# Patient Record
Sex: Female | Born: 1960 | Race: Black or African American | Hispanic: No | Marital: Married | Smoking: Former smoker
Health system: Southern US, Community
[De-identification: ages and names within clinical notes are randomized; demographics above are authoritative.]

## PROBLEM LIST (undated history)

## (undated) DIAGNOSIS — IMO0001 Reserved for inherently not codable concepts without codable children: Secondary | ICD-10-CM

## (undated) DIAGNOSIS — R252 Cramp and spasm: Secondary | ICD-10-CM

## (undated) DIAGNOSIS — Z8709 Personal history of other diseases of the respiratory system: Secondary | ICD-10-CM

## (undated) DIAGNOSIS — I639 Cerebral infarction, unspecified: Secondary | ICD-10-CM

## (undated) DIAGNOSIS — G4730 Sleep apnea, unspecified: Secondary | ICD-10-CM

## (undated) DIAGNOSIS — R350 Frequency of micturition: Secondary | ICD-10-CM

## (undated) DIAGNOSIS — E781 Pure hyperglyceridemia: Secondary | ICD-10-CM

## (undated) DIAGNOSIS — L309 Dermatitis, unspecified: Secondary | ICD-10-CM

## (undated) DIAGNOSIS — D573 Sickle-cell trait: Secondary | ICD-10-CM

## (undated) DIAGNOSIS — Z8701 Personal history of pneumonia (recurrent): Secondary | ICD-10-CM

## (undated) DIAGNOSIS — I10 Essential (primary) hypertension: Secondary | ICD-10-CM

## (undated) DIAGNOSIS — E119 Type 2 diabetes mellitus without complications: Secondary | ICD-10-CM

## (undated) DIAGNOSIS — K219 Gastro-esophageal reflux disease without esophagitis: Secondary | ICD-10-CM

## (undated) DIAGNOSIS — E785 Hyperlipidemia, unspecified: Secondary | ICD-10-CM

## (undated) DIAGNOSIS — M797 Fibromyalgia: Secondary | ICD-10-CM

## (undated) DIAGNOSIS — J45909 Unspecified asthma, uncomplicated: Secondary | ICD-10-CM

## (undated) DIAGNOSIS — D649 Anemia, unspecified: Secondary | ICD-10-CM

## (undated) HISTORY — PX: ABDOMINAL HYSTERECTOMY: SHX81

## (undated) HISTORY — PX: DIAGNOSTIC LAPAROSCOPY: SUR761

---

## 2014-08-11 ENCOUNTER — Emergency Department: Payer: Self-pay | Admitting: Emergency Medicine

## 2014-08-13 ENCOUNTER — Emergency Department: Payer: Self-pay | Admitting: Emergency Medicine

## 2014-10-24 ENCOUNTER — Emergency Department: Payer: Self-pay | Admitting: Emergency Medicine

## 2014-10-24 LAB — CBC
HCT: 41.1 % (ref 35.0–47.0)
HGB: 13.6 g/dL (ref 12.0–16.0)
MCH: 28.6 pg (ref 26.0–34.0)
MCHC: 33.1 g/dL (ref 32.0–36.0)
MCV: 87 fL (ref 80–100)
Platelet: 188 10*3/uL (ref 150–440)
RBC: 4.75 10*6/uL (ref 3.80–5.20)
RDW: 13.9 % (ref 11.5–14.5)
WBC: 9.5 10*3/uL (ref 3.6–11.0)

## 2014-10-24 LAB — URINALYSIS, COMPLETE
BACTERIA: NONE SEEN
Bilirubin,UR: NEGATIVE
Blood: NEGATIVE
KETONE: NEGATIVE
Leukocyte Esterase: NEGATIVE
NITRITE: NEGATIVE
Ph: 6 (ref 4.5–8.0)
Protein: NEGATIVE
RBC,UR: 1 /HPF (ref 0–5)
Specific Gravity: 1.017 (ref 1.003–1.030)
Squamous Epithelial: 1
WBC UR: 2 /HPF (ref 0–5)

## 2014-10-24 LAB — COMPREHENSIVE METABOLIC PANEL
ALK PHOS: 136 U/L — AB
ALT: 26 U/L
Albumin: 3.5 g/dL (ref 3.4–5.0)
Anion Gap: 11 (ref 7–16)
BUN: 12 mg/dL (ref 7–18)
Bilirubin,Total: 0.5 mg/dL (ref 0.2–1.0)
CO2: 28 mmol/L (ref 21–32)
CREATININE: 0.91 mg/dL (ref 0.60–1.30)
Calcium, Total: 9 mg/dL (ref 8.5–10.1)
Chloride: 94 mmol/L — ABNORMAL LOW (ref 98–107)
Glucose: 504 mg/dL (ref 65–99)
OSMOLALITY: 289 (ref 275–301)
Potassium: 4.5 mmol/L (ref 3.5–5.1)
SGOT(AST): 17 U/L (ref 15–37)
Sodium: 133 mmol/L — ABNORMAL LOW (ref 136–145)
Total Protein: 7.7 g/dL (ref 6.4–8.2)

## 2014-11-14 ENCOUNTER — Emergency Department: Payer: Self-pay | Admitting: Emergency Medicine

## 2014-11-14 LAB — URINALYSIS, COMPLETE
BILIRUBIN, UR: NEGATIVE
Blood: NEGATIVE
Glucose,UR: >=500 mg/dL (ref 0–75)
Nitrite: NEGATIVE
Ph: 5 (ref 4.5–8.0)
Protein: NEGATIVE
RBC,UR: 15 /HPF (ref 0–5)
SPECIFIC GRAVITY: 1.023 (ref 1.003–1.030)
Squamous Epithelial: 2
WBC UR: 3 /HPF (ref 0–5)

## 2014-11-14 LAB — COMPREHENSIVE METABOLIC PANEL
ALT: 38 U/L
Albumin: 3.9 g/dL (ref 3.4–5.0)
Alkaline Phosphatase: 208 U/L — ABNORMAL HIGH
Anion Gap: 14 (ref 7–16)
BILIRUBIN TOTAL: 0.7 mg/dL (ref 0.2–1.0)
BUN: 15 mg/dL (ref 7–18)
CREATININE: 0.88 mg/dL (ref 0.60–1.30)
Calcium, Total: 9.8 mg/dL (ref 8.5–10.1)
Chloride: 88 mmol/L — ABNORMAL LOW (ref 98–107)
Co2: 24 mmol/L (ref 21–32)
EGFR (African American): >60
EGFR (Non-African Amer.): >60
Glucose: 736 mg/dL (ref 65–99)
OSMOLALITY: 290 (ref 275–301)
Potassium: 4.4 mmol/L (ref 3.5–5.1)
SGOT(AST): 27 U/L (ref 15–37)
Sodium: 126 mmol/L — ABNORMAL LOW (ref 136–145)
Total Protein: 8.5 g/dL — ABNORMAL HIGH (ref 6.4–8.2)

## 2014-11-14 LAB — CBC WITH DIFFERENTIAL/PLATELET
BASOS PCT: 0.5 %
Basophil #: 0.1 10*3/uL (ref 0.0–0.1)
EOS ABS: 0.1 10*3/uL (ref 0.0–0.7)
Eosinophil %: 1.4 %
HCT: 44.8 % (ref 35.0–47.0)
HGB: 14.7 g/dL (ref 12.0–16.0)
Lymphocyte #: 2.5 10*3/uL (ref 1.0–3.6)
Lymphocyte %: 24.7 %
MCH: 28.8 pg (ref 26.0–34.0)
MCHC: 32.9 g/dL (ref 32.0–36.0)
MCV: 88 fL (ref 80–100)
MONO ABS: 0.7 x10 3/mm (ref 0.2–0.9)
Monocyte %: 6.8 %
Neutrophil #: 6.7 10*3/uL — ABNORMAL HIGH (ref 1.4–6.5)
Neutrophil %: 66.6 %
Platelet: 247 10*3/uL (ref 150–440)
RBC: 5.12 10*6/uL (ref 3.80–5.20)
RDW: 13.7 % (ref 11.5–14.5)
WBC: 10 10*3/uL (ref 3.6–11.0)

## 2014-11-18 HISTORY — PX: REDUCTION MAMMAPLASTY: SUR839

## 2014-12-12 ENCOUNTER — Observation Stay: Payer: Self-pay | Admitting: Internal Medicine

## 2014-12-12 LAB — BASIC METABOLIC PANEL
ANION GAP: 10 (ref 7–16)
BUN: 14 mg/dL (ref 7–18)
CALCIUM: 8.1 mg/dL — AB (ref 8.5–10.1)
CO2: 24 mmol/L (ref 21–32)
Chloride: 103 mmol/L (ref 98–107)
Creatinine: 0.74 mg/dL (ref 0.60–1.30)
EGFR (African American): >60
EGFR (Non-African Amer.): >60
Glucose: 373 mg/dL — ABNORMAL HIGH (ref 65–99)
Osmolality: 290 (ref 275–301)
Potassium: 4 mmol/L (ref 3.5–5.1)
Sodium: 137 mmol/L (ref 136–145)

## 2014-12-12 LAB — URINALYSIS, COMPLETE
Bilirubin,UR: NEGATIVE
Glucose,UR: >=500 mg/dL (ref 0–75)
NITRITE: NEGATIVE
PROTEIN: NEGATIVE
Ph: 5 (ref 4.5–8.0)
RBC,UR: 7 /HPF (ref 0–5)
Specific Gravity: 1.02 (ref 1.003–1.030)
WBC UR: 15 /HPF (ref 0–5)

## 2014-12-12 LAB — COMPREHENSIVE METABOLIC PANEL
ALBUMIN: 3.1 g/dL — AB (ref 3.4–5.0)
ALT: 30 U/L
ANION GAP: 16 (ref 7–16)
AST: 24 U/L (ref 15–37)
Alkaline Phosphatase: 136 U/L — ABNORMAL HIGH
BUN: 16 mg/dL (ref 7–18)
Bilirubin,Total: 0.5 mg/dL (ref 0.2–1.0)
Calcium, Total: 9.2 mg/dL (ref 8.5–10.1)
Chloride: 90 mmol/L — ABNORMAL LOW (ref 98–107)
Co2: 23 mmol/L (ref 21–32)
Creatinine: 0.87 mg/dL (ref 0.60–1.30)
EGFR (African American): >60
EGFR (Non-African Amer.): >60
GLUCOSE: 624 mg/dL — AB (ref 65–99)
Osmolality: 289 (ref 275–301)
Potassium: 4 mmol/L (ref 3.5–5.1)
Sodium: 129 mmol/L — ABNORMAL LOW (ref 136–145)
Total Protein: 7.6 g/dL (ref 6.4–8.2)

## 2014-12-12 LAB — BETA-HYDROXYBUTYRIC ACID: Beta-Hydroxybutyrate: 34.5 mg/dL — ABNORMAL HIGH (ref 0.2–2.8)

## 2014-12-12 LAB — HEMOGLOBIN A1C

## 2014-12-12 LAB — CBC
HCT: 41.3 % (ref 35.0–47.0)
HGB: 13.5 g/dL (ref 12.0–16.0)
MCH: 28.2 pg (ref 26.0–34.0)
MCHC: 32.5 g/dL (ref 32.0–36.0)
MCV: 87 fL (ref 80–100)
Platelet: 264 10*3/uL (ref 150–440)
RBC: 4.78 10*6/uL (ref 3.80–5.20)
RDW: 13.7 % (ref 11.5–14.5)
WBC: 16.6 10*3/uL — AB (ref 3.6–11.0)

## 2014-12-13 LAB — LIPID PANEL
Cholesterol: 143 mg/dL (ref 0–200)
HDL Cholesterol: 25 mg/dL — ABNORMAL LOW (ref 40–60)
Triglycerides: 635 mg/dL — ABNORMAL HIGH (ref 0–200)

## 2014-12-13 LAB — VANCOMYCIN, TROUGH: Vancomycin, Trough: 9 ug/mL — ABNORMAL LOW (ref 10–20)

## 2014-12-13 LAB — TSH: Thyroid Stimulating Horm: 1.37 u[IU]/mL

## 2014-12-14 LAB — VANCOMYCIN, TROUGH: Vancomycin, Trough: 11 ug/mL (ref 10–20)

## 2014-12-21 ENCOUNTER — Encounter: Payer: Self-pay | Admitting: Surgery

## 2014-12-29 ENCOUNTER — Encounter: Payer: Self-pay | Admitting: Surgery

## 2015-01-05 ENCOUNTER — Encounter: Payer: Self-pay | Admitting: Surgery

## 2015-01-12 DIAGNOSIS — I10 Essential (primary) hypertension: Secondary | ICD-10-CM | POA: Insufficient documentation

## 2015-01-12 DIAGNOSIS — E782 Mixed hyperlipidemia: Secondary | ICD-10-CM | POA: Insufficient documentation

## 2015-01-17 ENCOUNTER — Encounter: Payer: Self-pay | Admitting: Surgery

## 2015-03-19 NOTE — H&P (Signed)
PATIENT NAME:  Kristen Harris, MONGEAU MR#:  [id number] DATE OF BIRTH:  [birth year]  DATE OF ADMISSION:  12/12/2014  REFERRING PHYSICIAN:  Larae Grooms, MD  PRIMARY CARE PHYSICIAN:  Nonlocal.   ADMISSION DIAGNOSIS:  Hyperglycemia.   HISTORY OF PRESENT ILLNESS:  This is a 54 year old African-American female who presents to the Emergency Department complaining of one day of nausea, vomiting, and headache. The patient has had multiple episodes of emesis, none of which were bloody or bilious. Her headache is mostly global and comes and goes. She has not had any visual changes or problems with balance. Upon arrival to the Emergency Department, she was found to have a blood sugar greater than 600 as well as 1+ ketones in her urine. The rest of the patient's laboratory values were essentially normal, albeit a low sodium secondary to her elevated blood sugar. She also was found to have an abscess on her abdomen, which was drained and packed in the Emergency Department. After 2 liters of fluid resuscitation and 20 units of IV insulin, the patient's blood sugar came down to 442. Due to her ongoing need for glucose management as well as wound care, the Emergency Department called for admission.   REVIEW OF SYSTEMS: CONSTITUTIONAL:  The patient denies fever or weakness.  EYES:  Denies inflammation or blurred vision.  EARS, NOSE, AND THROAT:  Denies tinnitus or sore throat.  RESPIRATORY:  Denies cough or shortness of breath.  CARDIOVASCULAR:  Denies chest pain, palpitations, orthopnea, or paroxysmal nocturnal dyspnea.  GASTROINTESTINAL:  Admits to nausea and vomiting but denies diarrhea or abdominal pain at this time (the patient admits to diarrhea within the last 2 weeks due to metformin, but she has discontinued this medication).  GENITOURINARY:  Denies dysuria or increased frequency or hesitancy of urination, although today, she does admit that she feels like she has had to urinate more frequently.  ENDOCRINE:   Admits to polydipsia but denies polyuria.  HEMATOLOGIC AND LYMPHATIC:  Denies easy bruising or bleeding.  INTEGUMENTARY:  Admits to a rash under her breasts as well as a tender area on her anterior abdomen.  MUSCULOSKELETAL:  Denies myalgias or arthralgias.  NEUROLOGIC:  Admits to headache but denies numbness in her extremities or dysarthria.  PSYCHIATRIC:  Denies depression or suicidal ideation.   PAST MEDICAL HISTORY:  Diabetes type 2, fibromyalgia, gastroesophageal reflux disease, and hyperlipidemia.   PAST SURGICAL HISTORY:  Two C-sections and a hysterectomy.   SOCIAL HISTORY:  The patient does not smoke tobacco or do any illicit drugs. She is an occasional drinker.   FAMILY HISTORY:  Significant for diabetes in her father and various forms of cancer in her mother's relatives.   MEDICATIONS: 1.  Albuterol 90 mcg per inhalation 2 puffs inhaled 4 times a day as needed for coughing, wheezing, or shortness of breath.  2.  Cymbalta 30 mg delayed-release capsule 1 capsule p.o. daily.  3.  Januvia 100 mg 1 tablet p.o. daily.  4.  Lisinopril 10 mg 1 tab p.o. daily.  5.  Victoza 18 mg per 3 mL subcutaneous solution use as directed.   ALLERGIES:  ASPIRIN, ERYTHROMYCIN, PENICILLINS, AND TETRACYCLINE.   PERTINENT LABORATORY RESULTS AND RADIOGRAPHIC FINDINGS:  Serum glucose was originally 624, BUN 16, creatinine 0.87, serum sodium 129, potassium 4, chloride 90, bicarbonate 23, anion gap 16, calcium 9.2, beta hydroxybutyrate 34.5, serum albumin 3.1, alkaline phosphatase 136, AST 24, ALT 30. White blood cell count is 16.6, hemoglobin 13.5, hematocrit 41.3, platelet count 264,000. MCV is 87.  Urinalysis shows 1+ ketones and 2+ blood. It is nitrite negative. Leukocyte esterase is 1+. There is trace bacteria present.   CT of the head without contrast shows no acute intracranial abnormalities.   PHYSICAL EXAMINATION: VITAL SIGNS:  Temperature is 98.5, pulse 100, respirations 20, blood pressure  122/70, pulse oximetry 96% on room air.  GENERAL:  The patient is alert and oriented and in no apparent distress.  HEENT:  Normocephalic, atraumatic. Pupils are equal, round, and reactive to light and accommodation. Extraocular movements are intact. Mucous membranes are dry. The patient's hairline is receding from her forehead down her temples, and she has very thin hair in this area.  NECK:  Trachea is midline. No adenopathy. Thyroid is nonpalpable and nontender.  CHEST:  Symmetric and atraumatic.  CARDIOVASCULAR:  Regular rate and rhythm. Normal S1 and S2. No rubs, clicks, or murmurs.  LUNGS:  Clear to auscultation bilaterally. Normal effort and excursion.  ABDOMEN:  Positive bowel sounds. Soft, nontender, nondistended. No hepatosplenomegaly.  GENITOURINARY:  Deferred.  MUSCULOSKELETAL:  The patient moves all 4 extremities equally. I have not walked her. She has 5/5 strength in upper and lower extremities bilaterally.  SKIN:  It is warm and dry. The patient has intertriginous rashes that appear somewhat scaly but have other lesions which have somewhat raised borders with areas of central clearing and are on an erythematous base. On the patient's anterior abdomen there is an approximately 1 cm in diameter area of fluctuance that is tender and warm. She has another similar area that is actually nonfluctuant on her right flank that is somewhat smaller in size.  EXTREMITIES:  No clubbing, cyanosis, or edema.  NEUROLOGIC:  Cranial nerves II through XII are grossly intact.  PSYCHIATRIC:  Mood is normal. Affect is congruent. The patient has excellent insight and judgment into her medical condition.   ASSESSMENT AND PLAN:  This is a 54 year old female admitted for hyperglycemia.   1.  Hyperglycemia. The patient may be in the early stages of diabetic ketoacidosis, but she has no anion gap yet and her bicarbonate is normal at this time. Her blood sugar has greatly improved with intravenous fluid and  intravenous insulin. Once her blood sugar normalizes, we can start some basal insulin in addition to prandial dosing. I have ordered a diabetic nurse educator consult as well as a dietitian consult, as the patient states she is very confused as to why she keeps having these episodes of hyperglycemia and with hospitalization. It is clear at this time, however, that the patient is somehow on Januvia and Victoza simultaneously. This is not doing much to control the patient's blood sugar, and I have explained this to her. This is likely some miscommunication between her and the pharmacy or the doctor's office and the pharmacy. She will likely be a much better candidate for injectable insulin, as she does not tolerate metformin due to gastrointestinal side effects.  2.  Hyperlipidemia. Continue statin therapy.  3.  Gastroesophageal reflux disease. The patient may have an H2-blocker p.r.n.  4.  Fibromyalgia. Continue Cymbalta.  5.  Obesity. The patient's body mass index is 33.6. I have encouraged healthy diet and exercise.  6.  Deep vein thrombosis prophylaxis. Heparin.  7.  Gastrointestinal prophylaxis. None.   CODE STATUS:  The patient is a full code.   TIME SPENT ON ADMISSION ORDERS AND PATIENT CARE:  Approximately 35 minutes.   ____________________________ Norva Riffle. Marcille Blanco, MD msd:nb D: 12/12/2014 04:08:46 ET T: 12/12/2014 06:05:49 ET JOB#: [id number]  cc: Norva Riffle. Marcille Blanco, MD, <Dictator> Norva Riffle Eevie Lapp MD ELECTRONICALLY SIGNED 12/14/2014 1:52

## 2015-03-19 NOTE — Consult Note (Signed)
PATIENT NAME:  Kristen Harris, Kristen Harris MR#:  [id number] DATE OF BIRTH:  December 12, 1960  DATE OF CONSULTATION:  12/13/2014  REFERRING PHYSICIAN:  Epifanio Lesches, MD.   CONSULTING PHYSICIAN:  A. Lavone Orn, MD  ENDOCRINOLOGY CONSULTATON   CHIEF COMPLAINT: Hyperglycemia.   HISTORY OF PRESENT ILLNESS: This is a 54 year old female admitted yesterday with severe hyperglycemia as well as 1 day nausea, vomiting, and headache. Initial blood sugar was found to be greater than 600. She did not have acidosis. She did have 1+ urinary ketones and an elevated beta hydroxybutyrate level. She was found to have small ulceration beneath the bilateral breasts. She was admitted for management of diabetes and the infection. She has been started on antibiotics. She has been started on subcutaneous insulin. Over the last 24 hours, she has received Lantus 12 units as well as NovoLog per sliding scale, total of 22 units and Januvia 100 mg daily. Blood sugars have ranged 245-419. The patient feels poorly. Nausea has resolved. She is eating fairly well.   The patient reports she was diagnosed with diabetes in 2012. She recently relocated to [place] to take care of her ill father. She moved from the [place], [place] area. After moving here, she did not have regular medical care. She states she was off her diabetes medications for a while and then had trouble getting them consistently. States she has had several visits to the Emergency Room due to hyperglycemia. Outpatient medications had been Victoza 1.8 mg daily and Januvia 100 mg daily. The patient reports no known complications from diabetes. She denies hospitalizations for diabetes.   PAST MEDICAL HISTORY:  1. Type 2 diabetes.  2. GERD.  3. Hyperlipidemia.   PAST SURGICAL HISTORY:  1. C-section x2.  2. Hysterectomy.   SOCIAL HISTORY: The patient moved from [place] to [place] within last year. She lives with her husband and her 2 parents.  She has several children who are adult now. She does not smoke. Quit tobacco in 2010.  Social alcohol only.   FAMILY HISTORY: Father has diabetes and has had amputation of a lower extremity.   ALLERGIES:  ASPIRIN, ERYTHROMYCIN, PENICILLIN, TETRACYCLINE.   CURRENT MEDICATIONS:  1. Levemir 12 units at bedtime.  2. NovoLog insulin sliding scale.  3. Duloxetine 30 mg daily.  4. Januvia 100 mg daily.  5. Lopid 600 mg b.i.d.  6. Zestril 10 mg daily.  7. Omeprazole 20 mg daily.  8. Omega-3 fatty acid 2 grams daily.  9. Vancomycin 1 gram q. 8 hours IV.  10. Heparin 5000 units subcutaneous q. 8 hours.  REVIEW OF SYSTEMS:  GENERAL:  Denies headache.  Denies fever.  HEENT: Denies blurred vision. Denies sore throat.  NECK: Denies neck pain.  Denies dysphasia.  CARDIAC: Denies chest pain. Denies palpitation.  PULMONARY: Denies cough. Denies shortness breath.  ABDOMEN: Nausea and vomiting have resolved. Appetite is fair.  GENITOURINARY: Denies dysuria. Denies hematuria.  ENDOCRINE: Denies heat or cold intolerance.  HEMATOLOGIC: Denies easy bruising or recent bleeding.  SKIN:  She has a rash beneath both breasts and in the groin region. She reports small ulcerations under the breasts bilaterally.  NEUROLOGIC: No recent falls.  MUSCULOSKELETAL: Denies swelling or weakness.   PHYSICAL EXAMINATION:  VITAL SIGNS: Height 65.9 inches, weight 197 pounds, BMI 32, temperature 98.2, pulse 90, respirations 17, blood pressure 120/78.  GENERAL: Obese, African American female in no acute distress.  HEENT: EOMI.  Oropharynx is clear. Mucous membranes moist.  NECK: Supple. No appreciable thyromegaly.  CARDIAC: Regular rate and rhythm without murmur.  PULMONARY: Clear to auscultation bilaterally. No wheeze.  ABDOMEN: Diffusely soft, nontender, nondistended.  EXTREMITIES: No peripheral edema is present.  PSYCHIATRIC: Calm, cooperative, alert and oriented.  SKIN: There is an erythemic rash between the  breasts bilaterally. There is a less than 1 cm a healing ulceration beneath the right breast and approximately 2 cm ulceration beneath the left breast. Both are clean, dry and packed and dressed. No discharge.   LABORATORY DATA: Hemoglobin A1c pending.  Additional labs yesterday include glucose 373, BUN 14, creatinine 0.74, sodium 137, potassium 4.0, calcium 8.1, WBC 16.6, hematocrit 41.3, fructosamine 613.   ASSESSMENT:  1. Uncontrolled type 2 diabetes, uncomplicated.  2. Abscesses on chest wall bilaterally.  3. Obesity.   RECOMMENDATIONS:  1. Agree with basal-bolus insulin. She would benefit from scheduled prandial insulin in addition to the sliding scale.  2. Increase Levemir to 30 units at bedtime.  3. Add NovoLog 10 units t.i.d. a.c.  4. Continue current NovoLog insulin sliding scale.  5. Appreciate assistance of clinical diabetes educators.  6. Encourage the patient to monitor her blood sugars 3 times daily. Bring glucometer to each clinic visit.  7. Plan to follow up with her in 1-2 weeks after discharge.   I will follow along with you during hospitalization, as needed.    ____________________________ A. Lavone Orn, MD ams:by D: 12/13/2014 17:21:34 ET T: 12/13/2014 17:43:54 ET JOB#: [id number]  cc: A. Lavone Orn, MD, <Dictator> Sherlon Handing MD ELECTRONICALLY SIGNED 12/21/2014 13:03

## 2015-03-19 NOTE — Discharge Summary (Signed)
PATIENT NAME:  Kristen Harris, Kristen Harris MR#:  [id number] DATE OF BIRTH:  July 28, 1961  DATE OF ADMISSION:  12/12/2014 DATE OF DISCHARGE:  12/14/2014  DISCHARGE DIAGNOSES:  1.  Nausea and vomiting secondary to poorly controlled type 2 diabetes mellitus.  2.  Chest wall abscess, status post drainage.  3.  Hypertriglyceridemia.   DISCHARGE MEDICATIONS: Cymbalta 30 mg p.o. daily, albuterol 90 mcg inhalation 2 puffs 4 times daily, lisinopril 10 mg daily, Januvia 100 mg daily, omeprazole 20 mg p.o. daily,10 mg p.o. daily, Levemir 30 units at bedtime, NovoLog 10 units t.i.d., gemfibrozil 600 mg p.o. b.i.d., Bactrim 1 tablet p.o. b.i.d. for 10 days. Victoza is stopped. The patient is also on sliding scale with coverage for a blood sugar of 151-200, she is advised to take 2 units; sugar of 201-250, four units; 251-300, six units; and 301-350, eight units. The patient should a.c. and bedtime blood sugar checks.   DIET: Low-sodium, low-fat, ADA diet.   CONSULTATION: With endocrinology from Dr. Gabriel Carina.   HOSPITAL COURSE: This is a 54 year old female patient who came in because of nausea, vomiting, and headache. Blood sugar was found to be 600. The patient had chest wall abscess, which was drained in the ER, and she received vancomycin while in the hospital. The patient admitted to hospitalist service for nausea, vomiting and impending DKA. The patient's BUN 16, creatinine 0.87, sodium 129, potassium 4,. The patient received aggressive IV hydration and insulin with sliding scale coverage. The patient's symptoms improved nicely with aggressive hydration, and also patient started on Levemir along with sliding scale coverage. Hemoglobin A1c obtained and it was 15. The patient's creatinine was normal on admission. The patient's sodium improved to 137 with hydration. She had dizziness on admission. CT head was normal. As her blood sugar improved, the patient's TSH was normal. The patient was seen by Dr. Gabriel Carina. She suggested that the  patient can take NovoLog 10 units t.i.d. and Levemir at 30 units at bedtime. We also had our inpatient diabetic team consult and follow up with them. The patient is encouraged to have a blood sugar monitor and check 3 times daily. The patient will follow up with Dr. Gabriel Carina as an outpatient in about 1-2 weeks and also follow up with [facility] as an outpatient for diabetes management. The patient's symptoms nicely improved, and she said she feels much better than when she came. Advised her to be compliant with her medication for diabetes and also follow up with annual eye checkup. The patient also had hypertriglyceridemia. The patient's triglycerides were elevated; triglycerides in the blood were 635 and LDL could not be calculated because of severe elevation of triglycerides. We started her on gemfibrozil 600 mg p.o. b.i.d. Advised her to follow up with Dr. Gabriel Carina in about 1-2 weeks and then also have to have a 9-month checkup to follow lipids checkup and followup. The patient does have a chest wall abscess, which was drained in the Emergency Room. She received vancomycin in the hospital and discharged home with Bactrim DS 1 tablet b.i.d. for 10 days. The patient did not have any fever in the hospital.   DISCHARGE PHYSICAL EXAMINATION:  VITAL SIGNS: Temperature 98.4, heart rate 83, blood pressure 114/78, saturating 97% on room air.  CARDIOVASCULAR: S1, S2 regular. LUNGS: Clear to auscultation. No wheeze, no rales.  ABDOMEN: Soft, nontender, nondistended. Bowel sounds present. CHEST: Wall abscess drained and no evidence of collection, no evidence of abscess. Advised her to change dressings as needed.   TIME SPENT:  More than 30 minutes.    ____________________________ Epifanio Lesches, MD sk:bm D: 12/17/2014 16:29:00 ET T: 12/18/2014 03:10:21 ET JOB#: [id number]  cc: Epifanio Lesches, MD, <Dictator> A. Lavone Orn, MD Epifanio Lesches MD ELECTRONICALLY SIGNED 12/20/2014 13:34

## 2015-03-19 NOTE — Consult Note (Signed)
Chief Complaint and History:  Referring Physician Dr. Vianne Bulls   Allergies:  penicillins: Hives  Tetracycline: Hives  Erythromycin: Hives  Aspirin: Other  Orange: Other  Pork: Unknown  Tomato: Unknown  Assessment/Plan:  Assessment/Plan 54 yo F with type 2 diabetes, severely uncontrolled admitted with hyperglycemia (BG >600) without acidosis. She is now receiving Levemir 12 units qhs and Novolog SSI and sugars remain elevated. Last 24 hrs, sugars have ranged 245 - 419 and she has received in total 44 units of insulin.  Pt was intervewed, examined, and chart reviewed.   A/ Diabetes mellitus type 2 uncontrolled  P/ Adjust Levemir to 30 units qhs Add prandial Novolog 10 units tid AC Continue current SSI qACHS Will arrange out-pt f/up in 1-2 weeks.   Electronic Signatures: Judi Cong (MD)  (Signed 26-Jan-16 17:22)  Authored: Chief Complaint and History, ALLERGIES, HOME MEDICATIONS, Assessment/Plan   Last Updated: 26-Jan-16 17:22 by Judi Cong (MD)

## 2015-04-20 ENCOUNTER — Encounter: Payer: Self-pay | Admitting: *Deleted

## 2015-04-21 ENCOUNTER — Ambulatory Visit: Payer: 59 | Admitting: Certified Registered"

## 2015-04-21 ENCOUNTER — Ambulatory Visit
Admission: RE | Admit: 2015-04-21 | Discharge: 2015-04-21 | Disposition: A | Discharge status: Home / Self Care | Payer: 59 | Source: Ambulatory Visit | Attending: Gastroenterology | Admitting: Gastroenterology

## 2015-04-21 ENCOUNTER — Encounter
Admission: RE | Disposition: A | Discharge status: Home / Self Care | Payer: Self-pay | Source: Ambulatory Visit | Attending: Gastroenterology

## 2015-04-21 DIAGNOSIS — K5730 Diverticulosis of large intestine without perforation or abscess without bleeding: Secondary | ICD-10-CM | POA: Diagnosis not present

## 2015-04-21 DIAGNOSIS — J45909 Unspecified asthma, uncomplicated: Secondary | ICD-10-CM | POA: Insufficient documentation

## 2015-04-21 DIAGNOSIS — Z1211 Encounter for screening for malignant neoplasm of colon: Secondary | ICD-10-CM | POA: Diagnosis not present

## 2015-04-21 DIAGNOSIS — K644 Residual hemorrhoidal skin tags: Secondary | ICD-10-CM | POA: Diagnosis not present

## 2015-04-21 DIAGNOSIS — Z8673 Personal history of transient ischemic attack (TIA), and cerebral infarction without residual deficits: Secondary | ICD-10-CM | POA: Insufficient documentation

## 2015-04-21 DIAGNOSIS — Z79899 Other long term (current) drug therapy: Secondary | ICD-10-CM | POA: Insufficient documentation

## 2015-04-21 DIAGNOSIS — E119 Type 2 diabetes mellitus without complications: Secondary | ICD-10-CM | POA: Insufficient documentation

## 2015-04-21 DIAGNOSIS — K621 Rectal polyp: Secondary | ICD-10-CM | POA: Diagnosis not present

## 2015-04-21 DIAGNOSIS — G4730 Sleep apnea, unspecified: Secondary | ICD-10-CM | POA: Insufficient documentation

## 2015-04-21 DIAGNOSIS — Z794 Long term (current) use of insulin: Secondary | ICD-10-CM | POA: Insufficient documentation

## 2015-04-21 HISTORY — PX: COLONOSCOPY WITH PROPOFOL: SHX5780

## 2015-04-21 HISTORY — DX: Sleep apnea, unspecified: G47.30

## 2015-04-21 HISTORY — DX: Cerebral infarction, unspecified: I63.9

## 2015-04-21 HISTORY — DX: Type 2 diabetes mellitus without complications: E11.9

## 2015-04-21 HISTORY — DX: Unspecified asthma, uncomplicated: J45.909

## 2015-04-21 LAB — GLUCOSE, CAPILLARY: Glucose-Capillary: 96 mg/dL (ref 65–99)

## 2015-04-21 SURGERY — COLONOSCOPY WITH PROPOFOL
Anesthesia: General

## 2015-04-21 MED ORDER — SODIUM CHLORIDE 0.9 % IV SOLN
INTRAVENOUS | Status: DC
  Administered 2015-04-21: 1000 mL via INTRAVENOUS

## 2015-04-21 MED ORDER — ACETAMINOPHEN 500 MG PO TABS
ORAL_TABLET | ORAL | Status: AC
  Administered 2015-04-21: 500 mg
  Filled 2015-04-21: qty 2

## 2015-04-21 MED ORDER — ACETAMINOPHEN 500 MG PO TABS: 1000.0000 mg | ORAL_TABLET | Freq: Four times a day (QID) | ORAL | Status: DC | PRN

## 2015-04-21 MED ORDER — LIDOCAINE HCL (CARDIAC) 20 MG/ML IV SOLN
INTRAVENOUS | Status: DC | PRN
  Administered 2015-04-21: 50 mg via INTRAVENOUS

## 2015-04-21 MED ORDER — ACETAMINOPHEN 500 MG PO TABS: 500.0000 mg | ORAL_TABLET | Freq: Four times a day (QID) | ORAL | Status: DC | PRN

## 2015-04-21 MED ORDER — SODIUM CHLORIDE 0.9 % IV SOLN
INTRAVENOUS | Status: DC
  Administered 2015-04-21: 08:00:00 via INTRAVENOUS

## 2015-04-21 MED ORDER — PROPOFOL 10 MG/ML IV BOLUS
INTRAVENOUS | Status: DC | PRN
  Administered 2015-04-21: 70 mg via INTRAVENOUS
  Administered 2015-04-21: 30 mg via INTRAVENOUS

## 2015-04-21 MED ORDER — PROPOFOL INFUSION 10 MG/ML OPTIME
INTRAVENOUS | Status: DC | PRN
Start: 2015-04-21 — End: 2015-04-21
  Administered 2015-04-21: 120 ug/kg/min via INTRAVENOUS

## 2015-04-21 NOTE — Anesthesia Postprocedure Evaluation (Signed)
  Anesthesia Post-op Note  Patient: Kristen Harris  Procedure(s) Performed: Procedure(s): COLONOSCOPY WITH PROPOFOL (N/A)  Anesthesia type:General  Patient location: PACU  Post pain: Pain level controlled  Post assessment: Post-op Vital signs reviewed, Patient's Cardiovascular Status Stable, Respiratory Function Stable, Patent Airway and No signs of Nausea or vomiting  Post vital signs: Reviewed and stable  Last Vitals:  Filed Vitals:   04/21/15 0846  BP: 123/82  Pulse: 70  Temp: 35.9 C  Resp: 19    Level of consciousness: awake, alert  and patient cooperative  Complications: No apparent anesthesia complications

## 2015-04-21 NOTE — Op Note (Signed)
[facility] Gastroenterology Patient Name: Kristen Harris Procedure Date: 04/21/2015 7:36 AM MRN: [id number] Account #: [id number] Date of Birth: May 21, 1961 Admit Type: Outpatient Age: 54 Room: [facility] ENDO ROOM 1 Gender: Female Note Status: Finalized Procedure:         Colonoscopy Indications:       Screening for colorectal malignant neoplasm, This is the                     patient's first colonoscopy Patient Profile:   This is a 54 year old female. Providers:         Gerrit Heck. Rayann Heman, MD Referring MD:      No Local Md, MD (Referring MD) Medicines:         Propofol per Anesthesia Complications:     No immediate complications. Procedure:         Pre-Anesthesia Assessment:                    - Prior to the procedure, a History and Physical was                     performed, and patient medications and allergies were                     reviewed. The patient is competent. The risks and benefits                     of the procedure and the sedation options and risks were                     discussed with the patient. All questions were answered                     and informed consent was obtained. Patient identification                     and proposed procedure were verified by the physician and                     the nurse in the pre-procedure area. Mental Status                     Examination: alert and oriented. Airway Examination:                     normal oropharyngeal airway and neck mobility. Respiratory                     Examination: clear to auscultation. CV Examination: RRR,                     no murmurs, no S3 or S4. Prophylactic Antibiotics: The                     patient does not require prophylactic antibiotics. Prior                     Anticoagulants: The patient has taken no previous                     anticoagulant or antiplatelet agents. ASA Grade                     Assessment: II - A patient with mild systemic disease.  After reviewing the risks and benefits, the patient was                     deemed in satisfactory condition to undergo the procedure.                     The anesthesia plan was to use monitored anesthesia care                     (MAC). Immediately prior to administration of medications,                     the patient was re-assessed for adequacy to receive                     sedatives. The heart rate, respiratory rate, oxygen                     saturations, blood pressure, adequacy of pulmonary                     ventilation, and response to care were monitored                     throughout the procedure. The physical status of the                     patient was re-assessed after the procedure.                    - Prior to the procedure, a History and Physical was                     performed, and patient medications, allergies and                     sensitivities were reviewed. The patient's tolerance of                     previous anesthesia was reviewed.                    After obtaining informed consent, the colonoscope was                     passed under direct vision. Throughout the procedure, the                     patient's blood pressure, pulse, and oxygen saturations                     were monitored continuously. The Colonoscope was                     introduced through the anus and advanced to the the cecum,                     identified by appendiceal orifice and ileocecal valve. The                     colonoscopy was performed without difficulty. The patient                     tolerated the procedure well. The quality of the bowel  preparation was excellent. Findings:      A few small-mouthed diverticula were found in the sigmoid colon and in       the ascending colon.      A 2 mm polyp was found in the rectum. The polyp was sessile. The polyp       was removed with a jumbo cold forceps. Resection and retrieval were       complete.       The perianal exam findings include non-thrombosed external hemorrhoids.      The exam was otherwise without abnormality on direct and retroflexion       views. Impression:        - Diverticulosis in the sigmoid colon and in the ascending                     colon.                    - One 2 mm polyp in the rectum. Resected and retrieved.                    - Non-thrombosed external hemorrhoids found on perianal                     exam.                    - The examination was otherwise normal on direct and                     retroflexion views. Recommendation:    - Observe patient in GI recovery unit.                    - High fiber diet.                    - Continue present medications.                    - Await pathology results.                    - Repeat colonoscopy for surveillance based on pathology                     results.                    - Return to referring physician.                    - The findings and recommendations were discussed with the                     patient.                    - The findings and recommendations were discussed with the                     patient's family. Procedure Code(s): --- Professional ---                    [phone], Colonoscopy, flexible; with biopsy, single or                     multiple CPT copyright 2014 [employer]. All rights reserved. The codes documented in this report are preliminary and upon coder review  may  be revised to meet current compliance requirements. Mellody Life, MD 04/21/2015 8:42:56 AM This report has been signed electronically. Number of Addenda: 0 Note Initiated On: 04/21/2015 7:36 AM Scope Withdrawal Time: 0 hours 13 minutes 40 seconds  Total Procedure Duration: 0 hours 20 minutes 49 seconds       [facility]

## 2015-04-21 NOTE — Transfer of Care (Signed)
Immediate Anesthesia Transfer of Care Note  Patient: Kristen Harris  Procedure(s) Performed: Procedure(s): COLONOSCOPY WITH PROPOFOL (N/A)  Patient Location: Endoscopy Unit  Anesthesia Type:General  Level of Consciousness: awake and alert   Airway & Oxygen Therapy: Patient Spontanous Breathing and Patient connected to nasal cannula oxygen  Post-op Assessment: Report given to RN  Post vital signs: Reviewed  Last Vitals:  Filed Vitals:   04/21/15 0845  BP: 123/82  Pulse: 67  Temp: 35.9 C  Resp: 14    Complications: No apparent anesthesia complications

## 2015-04-21 NOTE — H&P (Signed)
  Primary Care Physician:  No PCP Per Patient  Pre-Procedure History & Physical: HPI:  Kristen Harris is a 54 y.o. female is here for an colonoscopy.   Past Medical History  Diagnosis Date  . Stroke   . Asthma   . Diabetes mellitus without complication   . Sleep apnea     No past surgical history on file.  Prior to Admission medications   Medication Sig Start Date End Date Taking? Authorizing Provider  Calcium Carb-Cholecalciferol 600-800 MG-UNIT CHEW Chew 1 tablet by mouth 2 (two) times daily.   Yes Historical Provider, MD  Cyanocobalamin (B-12 PO) Take 1 tablet by mouth daily.   Yes Historical Provider, MD  DULoxetine (CYMBALTA) 60 MG capsule Take 60 mg by mouth daily. 03/22/15  Yes Historical Provider, MD  JANUVIA 100 MG tablet Take 1 tablet by mouth daily. 04/02/15  Yes Historical Provider, MD  LEVEMIR FLEXTOUCH 100 UNIT/ML Pen 42 Units at bedtime. 03/17/15  Yes Historical Provider, MD  lisinopril (PRINIVIL,ZESTRIL) 10 MG tablet Take 1 tablet by mouth daily. 03/17/15  Yes Historical Provider, MD  Multiple Vitamins-Minerals (ALIVE WOMENS GUMMY) CHEW Chew 2 tablets by mouth daily.   Yes Historical Provider, MD  naproxen sodium (ANAPROX) 220 MG tablet Take 220 mg by mouth 2 (two) times daily as needed (headache).   Yes Historical Provider, MD  NOVOLOG FLEXPEN 100 UNIT/ML FlexPen 12 Units 3 (three) times daily. 03/17/15  Yes Historical Provider, MD  omeprazole (PRILOSEC) 20 MG capsule Take 1 capsule by mouth daily. 04/04/15  Yes Historical Provider, MD  triamcinolone cream (KENALOG) 0.1 % Apply 1 application topically 2 (two) times daily as needed. rash 01/12/15  Yes Historical Provider, MD  zolpidem (AMBIEN) 5 MG tablet Take 1 tablet by mouth at bedtime as needed for sleep.  03/22/15  Yes Historical Provider, MD    Allergies as of 04/12/2015  . (Not on File)    No family history on file.  History   Social History  . Marital Status: Married    Spouse Name: N/A  . Number of Children: N/A   . Years of Education: N/A   Occupational History  . Not on file.   Social History Main Topics  . Smoking status: Not on file  . Smokeless tobacco: Not on file  . Alcohol Use: Not on file  . Drug Use: Not on file  . Sexual Activity: Not on file   Other Topics Concern  . Not on file   Social History Narrative  . No narrative on file     Physical Exam: BP 119/79 mmHg  Pulse 71  Temp(Src) 96 F (35.6 C) (Tympanic)  Resp 16  Ht 4\' 8"  (1.422 m)  Wt 219 lb (99.338 kg)  BMI 49.13 kg/m2  SpO2 100% General:   Alert,  pleasant and cooperative in NAD Head:  Normocephalic and atraumatic. Neck:  Supple; no masses or thyromegaly. Lungs:  Clear throughout to auscultation.    Heart:  Regular rate and rhythm. Abdomen:  Soft, nontender and nondistended. Normal bowel sounds, without guarding, and without rebound.   Neurologic:  Alert and  oriented x4;  grossly normal neurologically.  Impression/Plan: Kristen Harris is here for an colonoscopy to be performed for screening  Risks, benefits, limitations, and alternatives regarding  colonoscopy have been reviewed with the patient.  Questions have been answered.  All parties agreeable.   Josefine Class, MD  04/21/2015, 8:11 AM

## 2015-04-21 NOTE — Anesthesia Preprocedure Evaluation (Signed)
Anesthesia Evaluation  Patient identified by MRN, date of birth, ID band Patient awake    Reviewed: Allergy & Precautions, NPO status , Patient's Chart, lab work & pertinent test results  History of Anesthesia Complications Negative for: history of anesthetic complications  Airway Mallampati: III       Dental  (+) Upper Dentures   Pulmonary asthma , sleep apnea ,    Pulmonary exam normal       Cardiovascular Exercise Tolerance: Good hypertension, Pt. on medications Rhythm:Regular Rate:Normal     Neuro/Psych CVA, No Residual Symptoms negative psych ROS   GI/Hepatic Neg liver ROS,   Endo/Other  diabetes, Well Controlled, Type 1, Insulin DependentHypothyroidism   Renal/GU   negative genitourinary   Musculoskeletal negative musculoskeletal ROS (+)   Abdominal (+) + obese,   Peds negative pediatric ROS (+)  Hematology negative hematology ROS (+)   Anesthesia Other Findings   Reproductive/Obstetrics negative OB ROS                             Anesthesia Physical Anesthesia Plan  ASA: III  Anesthesia Plan: General   Post-op Pain Management:    Induction: Intravenous  Airway Management Planned: Nasal Cannula  Additional Equipment:   Intra-op Plan:   Post-operative Plan:   Informed Consent: I have reviewed the patients History and Physical, chart, labs and discussed the procedure including the risks, benefits and alternatives for the proposed anesthesia with the patient or authorized representative who has indicated his/her understanding and acceptance.     Plan Discussed with: CRNA  Anesthesia Plan Comments:         Anesthesia Quick Evaluation

## 2015-04-21 NOTE — Discharge Instructions (Signed)

## 2015-04-24 LAB — SURGICAL PATHOLOGY

## 2015-04-26 ENCOUNTER — Encounter: Payer: Self-pay | Admitting: Gastroenterology

## 2015-05-24 ENCOUNTER — Other Ambulatory Visit: Payer: Self-pay | Admitting: Physician Assistant

## 2015-05-24 DIAGNOSIS — Z1231 Encounter for screening mammogram for malignant neoplasm of breast: Secondary | ICD-10-CM

## 2015-05-25 ENCOUNTER — Ambulatory Visit
Admission: RE | Admit: 2015-05-25 | Discharge: 2015-05-25 | Disposition: A | Discharge status: Home / Self Care | Payer: 59 | Source: Ambulatory Visit | Attending: Physician Assistant | Admitting: Physician Assistant

## 2015-05-25 DIAGNOSIS — Z1231 Encounter for screening mammogram for malignant neoplasm of breast: Secondary | ICD-10-CM | POA: Insufficient documentation

## 2015-08-03 ENCOUNTER — Other Ambulatory Visit: Payer: Self-pay | Admitting: Physician Assistant

## 2015-08-03 DIAGNOSIS — N63 Unspecified lump in unspecified breast: Secondary | ICD-10-CM

## 2015-08-09 ENCOUNTER — Ambulatory Visit
Admission: RE | Admit: 2015-08-09 | Discharge: 2015-08-09 | Disposition: A | Discharge status: Home / Self Care | Payer: 59 | Source: Ambulatory Visit | Attending: Physician Assistant | Admitting: Physician Assistant

## 2015-08-09 DIAGNOSIS — N63 Unspecified lump in unspecified breast: Secondary | ICD-10-CM

## 2015-08-14 ENCOUNTER — Other Ambulatory Visit: Payer: Self-pay | Admitting: Physician Assistant

## 2015-08-14 DIAGNOSIS — N6311 Unspecified lump in the right breast, upper outer quadrant: Secondary | ICD-10-CM

## 2015-09-04 ENCOUNTER — Other Ambulatory Visit (HOSPITAL_COMMUNITY): Payer: Self-pay | Admitting: Plastic Surgery

## 2015-09-06 ENCOUNTER — Ambulatory Visit
Admission: RE | Admit: 2015-09-06 | Discharge: 2015-09-06 | Disposition: A | Discharge status: Home / Self Care | Payer: Commercial Managed Care - HMO | Source: Ambulatory Visit | Attending: Physician Assistant | Admitting: Physician Assistant

## 2015-09-06 DIAGNOSIS — N6311 Unspecified lump in the right breast, upper outer quadrant: Secondary | ICD-10-CM

## 2015-09-06 DIAGNOSIS — N63 Unspecified lump in breast: Secondary | ICD-10-CM | POA: Diagnosis present

## 2015-09-18 NOTE — Pre-Procedure Instructions (Addendum)
Kristen Harris  09/18/2015     No Pharmacies Listed   Your procedure is scheduled on:  Thursday, November 3.  Report to [facility] Admitting at 5:30 A.M.               Your surgery is scheduled for 7:30 A.M.   Call this number if you have problems the morning of [phone]                For any other questions, please call [phone], Monday - Friday 8 AM - 4 PM.   Remember:  Do not eat food or drink liquids after midnight Thursday, November 2.  Take these medicines the morning of surgery with A SIP OF WATER : DULoxetine (CYMBALTA), omeprazole (PRILOSEC), Tylenol if needed                 Stop taking Aspirin, Coumadin, Plavix, Effient and Herbal medications.  Don not take any NSAIDs ie: Ibuprofen,  Advil,Naproxen or any medication containing Aspirin.      How to Manage Your Diabetes Before Surgery   Why is it important to control my blood sugar before and after surgery?   Improving blood sugar levels before and after surgery helps healing and can limit problems.  A way of improving blood sugar control is eating a healthy diet by:  - Eating less sugar and carbohydrates  - Increasing activity/exercise  - Talk with your doctor about reaching your blood sugar goals  High blood sugars (greater than 180 mg/dL) can raise your risk of infections and slow down your recovery so you will need to focus on controlling your diabetes during the weeks before surgery.  Make sure that the doctor who takes care of your diabetes knows about your planned surgery including the date and location.  How do I manage my blood sugars before surgery?   Check your blood sugar at least 4 times a day, 2 days before surgery to make sure that they are not too high or low.   Check your blood sugar the morning of your surgery when you wake up and every 2 hours until you get to the Short-Stay unit.  If your blood sugar is less than 70 mg/dL, you will need to treat for low blood  sugar by:  Treat a low blood sugar (less than 70 mg/dL) with 1/2 cup of clear juice (cranberry or apple), 4 glucose tablets, OR glucose gel.  Recheck blood sugar in 15 minutes after treatment (to make sure it is greater than 70 mg/dL).  If blood sugar is not greater than 70 mg/dL on re-check, call [phone] for further instructions.   Report your blood sugar to the Short-Stay nurse when you get to Short-Stay.  References:  [facility], 2007 "How to Manage your Diabetes Before and After Surgery".  What do I do about my diabetes medications?   Do not take oral diabetes medicines (pills) the morning of surgery.    THE NIGHT BEFORE SURGERY, take 33 units of Levemir Insulin.    THE MORNING OF SURGERY, take 0 units of  Insulin.    Do not take other diabetes injectables the day of surgery including Byetta, Victoza, Bydureon, and Trulicity.    If your CBG is greater than 220 mg/dL, you may take 1/2 of your sliding scale (correction) dose of insulin    Do not wear jewelry, make-up or nail polish.   Do not wear lotions, powders, or perfumes.  Do not shave 48 hours prior to surgery.    Do not bring valuables to the hospital.   [facility] is not responsible for any belongings or valuables.  Contacts, dentures or bridgework may not be worn into surgery.  Leave your suitcase in the car.  After surgery it may be brought to your room.  For patients admitted to the hospital, discharge time will be determined by your treatment team.  Patients discharged the day of surgery will not be allowed to drive home.    Special instructions:  Review  [facility] - Preparing For Surgery.  Please read over the following fact sheets that you were given. Pain Booklet, Coughing and Deep Breathing and Surgical Site Infection Prevention

## 2015-09-19 ENCOUNTER — Encounter (HOSPITAL_COMMUNITY): Payer: Self-pay

## 2015-09-19 ENCOUNTER — Encounter (HOSPITAL_COMMUNITY)
Admission: RE | Admit: 2015-09-19 | Discharge: 2015-09-19 | Disposition: A | Discharge status: Home / Self Care | Payer: Commercial Managed Care - HMO | Source: Ambulatory Visit | Attending: Plastic Surgery | Admitting: Plastic Surgery

## 2015-09-19 DIAGNOSIS — J45909 Unspecified asthma, uncomplicated: Secondary | ICD-10-CM | POA: Diagnosis not present

## 2015-09-19 DIAGNOSIS — E785 Hyperlipidemia, unspecified: Secondary | ICD-10-CM | POA: Diagnosis not present

## 2015-09-19 DIAGNOSIS — E781 Pure hyperglyceridemia: Secondary | ICD-10-CM | POA: Diagnosis not present

## 2015-09-19 DIAGNOSIS — M797 Fibromyalgia: Secondary | ICD-10-CM | POA: Diagnosis not present

## 2015-09-19 DIAGNOSIS — M542 Cervicalgia: Secondary | ICD-10-CM | POA: Diagnosis not present

## 2015-09-19 DIAGNOSIS — Z881 Allergy status to other antibiotic agents status: Secondary | ICD-10-CM | POA: Diagnosis not present

## 2015-09-19 DIAGNOSIS — Z23 Encounter for immunization: Secondary | ICD-10-CM | POA: Diagnosis not present

## 2015-09-19 DIAGNOSIS — Z6835 Body mass index (BMI) 35.0-35.9, adult: Secondary | ICD-10-CM | POA: Diagnosis not present

## 2015-09-19 DIAGNOSIS — N62 Hypertrophy of breast: Secondary | ICD-10-CM | POA: Diagnosis not present

## 2015-09-19 DIAGNOSIS — D573 Sickle-cell trait: Secondary | ICD-10-CM | POA: Diagnosis not present

## 2015-09-19 DIAGNOSIS — E119 Type 2 diabetes mellitus without complications: Secondary | ICD-10-CM | POA: Diagnosis not present

## 2015-09-19 DIAGNOSIS — M25511 Pain in right shoulder: Secondary | ICD-10-CM | POA: Diagnosis not present

## 2015-09-19 DIAGNOSIS — G4733 Obstructive sleep apnea (adult) (pediatric): Secondary | ICD-10-CM | POA: Diagnosis not present

## 2015-09-19 DIAGNOSIS — M25512 Pain in left shoulder: Secondary | ICD-10-CM | POA: Diagnosis not present

## 2015-09-19 DIAGNOSIS — Z886 Allergy status to analgesic agent status: Secondary | ICD-10-CM | POA: Diagnosis not present

## 2015-09-19 DIAGNOSIS — K219 Gastro-esophageal reflux disease without esophagitis: Secondary | ICD-10-CM | POA: Diagnosis not present

## 2015-09-19 DIAGNOSIS — Z880 Allergy status to penicillin: Secondary | ICD-10-CM | POA: Diagnosis not present

## 2015-09-19 DIAGNOSIS — Z794 Long term (current) use of insulin: Secondary | ICD-10-CM | POA: Diagnosis not present

## 2015-09-19 DIAGNOSIS — Z91018 Allergy to other foods: Secondary | ICD-10-CM | POA: Diagnosis not present

## 2015-09-19 DIAGNOSIS — I10 Essential (primary) hypertension: Secondary | ICD-10-CM | POA: Diagnosis not present

## 2015-09-19 DIAGNOSIS — Z87891 Personal history of nicotine dependence: Secondary | ICD-10-CM | POA: Diagnosis not present

## 2015-09-19 HISTORY — DX: Personal history of other diseases of the respiratory system: Z87.09

## 2015-09-19 HISTORY — DX: Anemia, unspecified: D64.9

## 2015-09-19 HISTORY — DX: Sickle-cell trait: D57.3

## 2015-09-19 HISTORY — DX: Essential (primary) hypertension: I10

## 2015-09-19 HISTORY — DX: Fibromyalgia: M79.7

## 2015-09-19 HISTORY — DX: Dermatitis, unspecified: L30.9

## 2015-09-19 HISTORY — DX: Frequency of micturition: R35.0

## 2015-09-19 HISTORY — DX: Pure hyperglyceridemia: E78.1

## 2015-09-19 HISTORY — DX: Personal history of pneumonia (recurrent): Z87.01

## 2015-09-19 HISTORY — DX: Gastro-esophageal reflux disease without esophagitis: K21.9

## 2015-09-19 HISTORY — DX: Reserved for inherently not codable concepts without codable children: IMO0001

## 2015-09-19 HISTORY — DX: Hyperlipidemia, unspecified: E78.5

## 2015-09-19 LAB — BASIC METABOLIC PANEL
Anion gap: 11 (ref 5–15)
BUN: 7 mg/dL (ref 6–20)
CO2: 24 mmol/L (ref 22–32)
Calcium: 9.2 mg/dL (ref 8.9–10.3)
Chloride: 103 mmol/L (ref 101–111)
Creatinine, Ser: 0.71 mg/dL (ref 0.44–1.00)
GFR calc Af Amer: >60 mL/min (ref >60)
GLUCOSE: 162 mg/dL — AB (ref 65–99)
POTASSIUM: 3.9 mmol/L (ref 3.5–5.1)
Sodium: 138 mmol/L (ref 135–145)

## 2015-09-19 LAB — CBC
HEMATOCRIT: 38.7 % (ref 36.0–46.0)
Hemoglobin: 13 g/dL (ref 12.0–15.0)
MCH: 28.6 pg (ref 26.0–34.0)
MCHC: 33.6 g/dL (ref 30.0–36.0)
MCV: 85.2 fL (ref 78.0–100.0)
Platelets: 238 10*3/uL (ref 150–400)
RBC: 4.54 MIL/uL (ref 3.87–5.11)
RDW: 13.2 % (ref 11.5–15.5)
WBC: 9.2 10*3/uL (ref 4.0–10.5)

## 2015-09-19 LAB — GLUCOSE, CAPILLARY: GLUCOSE-CAPILLARY: 174 mg/dL — AB (ref 65–99)

## 2015-09-19 NOTE — Progress Notes (Signed)
   09/19/15 0854  OBSTRUCTIVE SLEEP APNEA  Have you ever been diagnosed with sleep apnea through a sleep study? Yes  If yes, do you have and use a CPAP or BPAP machine every night? 0  Do you snore loudly (loud enough to be heard through closed doors)?  1  Do you often feel tired, fatigued, or sleepy during the daytime (such as falling asleep during driving or talking to someone)? 1  Has anyone observed you stop breathing during your sleep? 1  Do you have, or are you being treated for high blood pressure? 1  BMI more than 35 kg/m2? 1  Age > 50 (1-yes) 1  Neck circumference greater than:Female 16 inches or larger, Female 17inches or larger? 0  Female Gender (Yes=1) 0  Obstructive Sleep Apnea Score 6

## 2015-09-19 NOTE — Progress Notes (Signed)
PCP - Boykin Reaper Cardiologist - denies  EKG- 04/07/15 - Care Everywhere - requested tracing CXR -denies  Echo - denies Stress test- pt. States that she had one in 2011 in [place], [place] - requested records Cardiac Cath - denies  Patient states that she was diagnosed with sleep apnea about 5 years ago but does not wear CPAP at night because her PCP wants a new sleep study.  Patient screened again for sleep apnea and found at risk, sent results to PCP.    Patient states that she checks her blood sugar 1-2 times a day but sometimes skips a day.

## 2015-09-19 NOTE — Progress Notes (Signed)
Patient has allergy to pork. Heparin ordered for day of surgery which is a pork derived product.  Dr. Alvira Philips office made aware of allergy.

## 2015-09-20 LAB — HEMOGLOBIN A1C
Hgb A1c MFr Bld: 7.1 % — ABNORMAL HIGH (ref 4.8–5.6)
Mean Plasma Glucose: 157 mg/dL

## 2015-09-20 NOTE — Progress Notes (Signed)
Anesthesia Chart Review: Patient is a 54 year old female scheduled for bilateral breast reduction using free nipple graft technique on 09/21/15 by Dr. Harlow Mares.  History includes former smoker, asthma, DM2, HLD, Sickle cell trait, CVA, HTN, GERD, hypertriglyceridemia, fibromyalgia, SOB, anemia, hysterectomy, OSA without CPAP use. BMI is consistent with obesity. PCP is Boykin Reaper, PA-C with [facility]. Endocrinologist is Dr. Lucilla Lame. [facility] records can be viewed in [place] [place].  Meds include Cymbalta, Januvia, Levemir, lisinopril, Prilosec, Ambien. Patient's allergies includes citrus, tomato, pork-derived products (hives), Morphine, tetracyclines, PCNs, erythromycin, and ASA.  Heparin is contraindicated for patient with hypersensitives to pork products, so patient's PAT RN did call and notify Dr. Alvira Philips office.    04/13/15 EKG [facility]): NSR, poor r wave progression. Reported a stress test in 2011 in [place], [place]. Records from [facility] in [place] received and did not include or mention a stress test. No CP symptoms documented at her PAT visit. No known CAD/MI or CHF history.   Preoperative labs noted. A1C 7.1.  Patient will be evaluated by her anesthesiologist on the day of surgery. If she remains asymptomatic from a CV standpoint then I would anticipate that she could proceed as planned.  George Hugh [facility] Short Stay Center/Anesthesiology Phone [phone] 09/20/2015 11:03 AM

## 2015-09-21 ENCOUNTER — Encounter (HOSPITAL_COMMUNITY)
Admission: RE | Disposition: A | Discharge status: Home / Self Care | Payer: Self-pay | Source: Ambulatory Visit | Attending: Plastic Surgery

## 2015-09-21 ENCOUNTER — Encounter (HOSPITAL_COMMUNITY): Payer: Self-pay | Admitting: *Deleted

## 2015-09-21 ENCOUNTER — Ambulatory Visit (HOSPITAL_COMMUNITY): Payer: Commercial Managed Care - HMO | Admitting: Vascular Surgery

## 2015-09-21 ENCOUNTER — Ambulatory Visit (HOSPITAL_COMMUNITY): Payer: Commercial Managed Care - HMO | Admitting: Anesthesiology

## 2015-09-21 ENCOUNTER — Ambulatory Visit (HOSPITAL_COMMUNITY)
Admission: RE | Admit: 2015-09-21 | Discharge: 2015-09-22 | Disposition: A | Discharge status: Home / Self Care | Payer: Commercial Managed Care - HMO | Source: Ambulatory Visit | Attending: Plastic Surgery | Admitting: Plastic Surgery

## 2015-09-21 DIAGNOSIS — E781 Pure hyperglyceridemia: Secondary | ICD-10-CM | POA: Insufficient documentation

## 2015-09-21 DIAGNOSIS — Z794 Long term (current) use of insulin: Secondary | ICD-10-CM | POA: Insufficient documentation

## 2015-09-21 DIAGNOSIS — E785 Hyperlipidemia, unspecified: Secondary | ICD-10-CM | POA: Insufficient documentation

## 2015-09-21 DIAGNOSIS — Z881 Allergy status to other antibiotic agents status: Secondary | ICD-10-CM | POA: Insufficient documentation

## 2015-09-21 DIAGNOSIS — N62 Hypertrophy of breast: Secondary | ICD-10-CM | POA: Insufficient documentation

## 2015-09-21 DIAGNOSIS — D573 Sickle-cell trait: Secondary | ICD-10-CM | POA: Insufficient documentation

## 2015-09-21 DIAGNOSIS — Z91018 Allergy to other foods: Secondary | ICD-10-CM | POA: Insufficient documentation

## 2015-09-21 DIAGNOSIS — M797 Fibromyalgia: Secondary | ICD-10-CM | POA: Insufficient documentation

## 2015-09-21 DIAGNOSIS — G4733 Obstructive sleep apnea (adult) (pediatric): Secondary | ICD-10-CM | POA: Insufficient documentation

## 2015-09-21 DIAGNOSIS — Z23 Encounter for immunization: Secondary | ICD-10-CM | POA: Insufficient documentation

## 2015-09-21 DIAGNOSIS — Z6835 Body mass index (BMI) 35.0-35.9, adult: Secondary | ICD-10-CM | POA: Insufficient documentation

## 2015-09-21 DIAGNOSIS — E119 Type 2 diabetes mellitus without complications: Secondary | ICD-10-CM | POA: Insufficient documentation

## 2015-09-21 DIAGNOSIS — Z87891 Personal history of nicotine dependence: Secondary | ICD-10-CM | POA: Insufficient documentation

## 2015-09-21 DIAGNOSIS — M25511 Pain in right shoulder: Secondary | ICD-10-CM | POA: Insufficient documentation

## 2015-09-21 DIAGNOSIS — M25512 Pain in left shoulder: Secondary | ICD-10-CM | POA: Insufficient documentation

## 2015-09-21 DIAGNOSIS — K219 Gastro-esophageal reflux disease without esophagitis: Secondary | ICD-10-CM | POA: Insufficient documentation

## 2015-09-21 DIAGNOSIS — I10 Essential (primary) hypertension: Secondary | ICD-10-CM | POA: Insufficient documentation

## 2015-09-21 DIAGNOSIS — M542 Cervicalgia: Secondary | ICD-10-CM | POA: Insufficient documentation

## 2015-09-21 DIAGNOSIS — Z880 Allergy status to penicillin: Secondary | ICD-10-CM | POA: Insufficient documentation

## 2015-09-21 DIAGNOSIS — Z886 Allergy status to analgesic agent status: Secondary | ICD-10-CM | POA: Insufficient documentation

## 2015-09-21 DIAGNOSIS — J45909 Unspecified asthma, uncomplicated: Secondary | ICD-10-CM | POA: Insufficient documentation

## 2015-09-21 HISTORY — PX: BREAST REDUCTION SURGERY: SHX8

## 2015-09-21 LAB — GLUCOSE, CAPILLARY
GLUCOSE-CAPILLARY: 150 mg/dL — AB (ref 65–99)
GLUCOSE-CAPILLARY: 136 mg/dL — AB (ref 65–99)
GLUCOSE-CAPILLARY: 121 mg/dL — AB (ref 65–99)
Glucose-Capillary: 115 mg/dL — ABNORMAL HIGH (ref 65–99)
Glucose-Capillary: 177 mg/dL — ABNORMAL HIGH (ref 65–99)

## 2015-09-21 SURGERY — MAMMOPLASTY, REDUCTION
Anesthesia: General | Site: Breast | Laterality: Bilateral

## 2015-09-21 MED ORDER — LISINOPRIL 10 MG PO TABS
10.0000 mg | ORAL_TABLET | Freq: Every day | ORAL | Status: DC
Start: 2015-09-21 — End: 2015-09-23
  Administered 2015-09-21 – 2015-09-22 (×2): 10 mg via ORAL
  Filled 2015-09-21 (×2): qty 1

## 2015-09-21 MED ORDER — HEPARIN SODIUM (PORCINE) 5000 UNIT/ML IJ SOLN: 5000.0000 [IU] | Freq: Once | INTRAMUSCULAR | Status: DC

## 2015-09-21 MED ORDER — SCOPOLAMINE 1 MG/3DAYS TD PT72
MEDICATED_PATCH | TRANSDERMAL | Status: DC | PRN
  Administered 2015-09-21: 1 via TRANSDERMAL

## 2015-09-21 MED ORDER — LACTATED RINGERS IV SOLN
INTRAVENOUS | Status: DC | PRN
  Administered 2015-09-21 (×2): via INTRAVENOUS

## 2015-09-21 MED ORDER — MIDAZOLAM HCL 2 MG/2ML IJ SOLN
INTRAMUSCULAR | Status: AC
  Filled 2015-09-21: qty 4

## 2015-09-21 MED ORDER — ACETAMINOPHEN 10 MG/ML IV SOLN
1000.0000 mg | Freq: Once | INTRAVENOUS | Status: AC
  Administered 2015-09-21: 1000 mg via INTRAVENOUS
  Filled 2015-09-21: qty 100

## 2015-09-21 MED ORDER — CIPROFLOXACIN IN D5W 400 MG/200ML IV SOLN
400.0000 mg | INTRAVENOUS | Status: AC
  Administered 2015-09-21: 400 mg via INTRAVENOUS
  Filled 2015-09-21: qty 200

## 2015-09-21 MED ORDER — INSULIN DETEMIR 100 UNIT/ML ~~LOC~~ SOLN
42.0000 [IU] | Freq: Every day | SUBCUTANEOUS | Status: DC
  Administered 2015-09-21: 42 [IU] via SUBCUTANEOUS
  Filled 2015-09-21 (×2): qty 0.42

## 2015-09-21 MED ORDER — PANTOPRAZOLE SODIUM 40 MG PO TBEC
40.0000 mg | DELAYED_RELEASE_TABLET | Freq: Every day | ORAL | Status: DC
  Administered 2015-09-22: 40 mg via ORAL
  Filled 2015-09-21: qty 1

## 2015-09-21 MED ORDER — LIDOCAINE HCL (CARDIAC) 20 MG/ML IV SOLN
INTRAVENOUS | Status: DC | PRN
  Administered 2015-09-21: 60 mg via INTRAVENOUS

## 2015-09-21 MED ORDER — SODIUM CHLORIDE 0.45 % IV SOLN
INTRAVENOUS | Status: DC
  Administered 2015-09-21 – 2015-09-22 (×2): via INTRAVENOUS

## 2015-09-21 MED ORDER — LINAGLIPTIN 5 MG PO TABS
5.0000 mg | ORAL_TABLET | Freq: Every day | ORAL | Status: DC
  Administered 2015-09-21 – 2015-09-22 (×2): 5 mg via ORAL
  Filled 2015-09-21 (×2): qty 1

## 2015-09-21 MED ORDER — LIDOCAINE HCL (CARDIAC) 20 MG/ML IV SOLN
INTRAVENOUS | Status: AC
  Filled 2015-09-21: qty 5

## 2015-09-21 MED ORDER — 0.9 % SODIUM CHLORIDE (POUR BTL) OPTIME
TOPICAL | Status: DC | PRN
  Administered 2015-09-21: 2000 mL

## 2015-09-21 MED ORDER — MIDAZOLAM HCL 5 MG/5ML IJ SOLN
INTRAMUSCULAR | Status: DC | PRN
  Administered 2015-09-21: 2 mg via INTRAVENOUS

## 2015-09-21 MED ORDER — HYDROMORPHONE HCL 2 MG PO TABS
2.0000 mg | ORAL_TABLET | ORAL | Status: DC | PRN
  Administered 2015-09-21: 4 mg via ORAL
  Administered 2015-09-21: 2 mg via ORAL
  Administered 2015-09-21 – 2015-09-22 (×3): 4 mg via ORAL
  Filled 2015-09-21 (×2): qty 2
  Filled 2015-09-21: qty 1
  Filled 2015-09-21: qty 2

## 2015-09-21 MED ORDER — INSULIN ASPART 100 UNIT/ML ~~LOC~~ SOLN
6.0000 [IU] | Freq: Three times a day (TID) | SUBCUTANEOUS | Status: DC
  Administered 2015-09-21 – 2015-09-22 (×3): 6 [IU] via SUBCUTANEOUS

## 2015-09-21 MED ORDER — INSULIN ASPART 100 UNIT/ML ~~LOC~~ SOLN
0.0000 [IU] | Freq: Three times a day (TID) | SUBCUTANEOUS | Status: DC
  Administered 2015-09-21 – 2015-09-22 (×2): 3 [IU] via SUBCUTANEOUS

## 2015-09-21 MED ORDER — FONDAPARINUX SODIUM 2.5 MG/0.5ML ~~LOC~~ SOLN
2.5000 mg | Freq: Once | SUBCUTANEOUS | Status: AC
  Administered 2015-09-21: 2.5 mg via SUBCUTANEOUS
  Filled 2015-09-21: qty 0.5

## 2015-09-21 MED ORDER — GLYCOPYRROLATE 0.2 MG/ML IJ SOLN
INTRAMUSCULAR | Status: DC | PRN
  Administered 2015-09-21: 0.4 mg via INTRAVENOUS

## 2015-09-21 MED ORDER — METHOCARBAMOL 500 MG PO TABS
ORAL_TABLET | ORAL | Status: AC
  Filled 2015-09-21: qty 1

## 2015-09-21 MED ORDER — PROMETHAZINE HCL 25 MG/ML IJ SOLN: 6.2500 mg | INTRAMUSCULAR | Status: DC | PRN

## 2015-09-21 MED ORDER — HYDROMORPHONE HCL 1 MG/ML IJ SOLN
INTRAMUSCULAR | Status: AC
  Administered 2015-09-21: 0.25 mg via INTRAVENOUS
  Filled 2015-09-21: qty 1

## 2015-09-21 MED ORDER — DOCUSATE SODIUM 100 MG PO CAPS
100.0000 mg | ORAL_CAPSULE | Freq: Every day | ORAL | Status: DC
  Administered 2015-09-21 – 2015-09-22 (×2): 100 mg via ORAL
  Filled 2015-09-21 (×3): qty 1

## 2015-09-21 MED ORDER — DEXAMETHASONE SODIUM PHOSPHATE 4 MG/ML IJ SOLN
INTRAMUSCULAR | Status: DC | PRN
  Administered 2015-09-21: 4 mg via INTRAVENOUS

## 2015-09-21 MED ORDER — PROPOFOL 10 MG/ML IV BOLUS
INTRAVENOUS | Status: DC | PRN
  Administered 2015-09-21: 150 mg via INTRAVENOUS

## 2015-09-21 MED ORDER — VANCOMYCIN HCL IN DEXTROSE 1-5 GM/200ML-% IV SOLN
1000.0000 mg | INTRAVENOUS | Status: AC
  Administered 2015-09-21: 1000 mg via INTRAVENOUS
  Filled 2015-09-21: qty 200

## 2015-09-21 MED ORDER — ONDANSETRON HCL 4 MG/2ML IJ SOLN
INTRAMUSCULAR | Status: AC
  Filled 2015-09-21: qty 2

## 2015-09-21 MED ORDER — INFLUENZA VAC SPLIT QUAD 0.5 ML IM SUSY
0.5000 mL | PREFILLED_SYRINGE | INTRAMUSCULAR | Status: AC
  Administered 2015-09-22: 0.5 mL via INTRAMUSCULAR
  Filled 2015-09-21: qty 0.5

## 2015-09-21 MED ORDER — INSULIN ASPART 100 UNIT/ML ~~LOC~~ SOLN: 0.0000 [IU] | Freq: Every day | SUBCUTANEOUS | Status: DC

## 2015-09-21 MED ORDER — ROCURONIUM BROMIDE 100 MG/10ML IV SOLN
INTRAVENOUS | Status: DC | PRN
  Administered 2015-09-21: 50 mg via INTRAVENOUS

## 2015-09-21 MED ORDER — FENTANYL CITRATE (PF) 250 MCG/5ML IJ SOLN
INTRAMUSCULAR | Status: AC
  Filled 2015-09-21: qty 5

## 2015-09-21 MED ORDER — FENTANYL CITRATE (PF) 100 MCG/2ML IJ SOLN
INTRAMUSCULAR | Status: DC | PRN
  Administered 2015-09-21: 50 ug via INTRAVENOUS
  Administered 2015-09-21: 100 ug via INTRAVENOUS
  Administered 2015-09-21: 50 ug via INTRAVENOUS

## 2015-09-21 MED ORDER — HYDROMORPHONE HCL 1 MG/ML IJ SOLN
0.2500 mg | INTRAMUSCULAR | Status: DC | PRN
  Administered 2015-09-21: 0.5 mg via INTRAVENOUS
  Administered 2015-09-21 (×2): 0.25 mg via INTRAVENOUS

## 2015-09-21 MED ORDER — METHOCARBAMOL 500 MG PO TABS
500.0000 mg | ORAL_TABLET | Freq: Four times a day (QID) | ORAL | Status: DC | PRN
Start: 2015-09-21 — End: 2015-09-23
  Administered 2015-09-21 (×2): 500 mg via ORAL
  Filled 2015-09-21: qty 1

## 2015-09-21 MED ORDER — DULOXETINE HCL 60 MG PO CPEP
60.0000 mg | ORAL_CAPSULE | Freq: Every day | ORAL | Status: DC
  Administered 2015-09-22: 60 mg via ORAL
  Filled 2015-09-21: qty 1

## 2015-09-21 MED ORDER — DEXAMETHASONE SODIUM PHOSPHATE 4 MG/ML IJ SOLN
INTRAMUSCULAR | Status: AC
  Filled 2015-09-21: qty 2

## 2015-09-21 MED ORDER — PHENYLEPHRINE HCL 10 MG/ML IJ SOLN
INTRAMUSCULAR | Status: DC | PRN
  Administered 2015-09-21 (×2): 80 ug via INTRAVENOUS
  Administered 2015-09-21: 40 ug via INTRAVENOUS

## 2015-09-21 MED ORDER — CETYLPYRIDINIUM CHLORIDE 0.05 % MT LIQD
7.0000 mL | Freq: Two times a day (BID) | OROMUCOSAL | Status: DC
  Administered 2015-09-21 – 2015-09-22 (×3): 7 mL via OROMUCOSAL

## 2015-09-21 MED ORDER — SCOPOLAMINE 1 MG/3DAYS TD PT72
MEDICATED_PATCH | TRANSDERMAL | Status: AC
  Filled 2015-09-21: qty 1

## 2015-09-21 MED ORDER — NEOSTIGMINE METHYLSULFATE 10 MG/10ML IV SOLN
INTRAVENOUS | Status: DC | PRN
Start: 2015-09-21 — End: 2015-09-21
  Administered 2015-09-21: 3 mg via INTRAVENOUS

## 2015-09-21 MED ORDER — ALBUTEROL SULFATE (2.5 MG/3ML) 0.083% IN NEBU: 3.0000 mL | INHALATION_SOLUTION | Freq: Four times a day (QID) | RESPIRATORY_TRACT | Status: DC | PRN

## 2015-09-21 MED ORDER — ONDANSETRON HCL 4 MG/2ML IJ SOLN
INTRAMUSCULAR | Status: DC | PRN
  Administered 2015-09-21 (×2): 4 mg via INTRAVENOUS

## 2015-09-21 MED ORDER — VANCOMYCIN HCL IN DEXTROSE 1-5 GM/200ML-% IV SOLN
1000.0000 mg | Freq: Once | INTRAVENOUS | Status: AC
  Administered 2015-09-21: 1000 mg via INTRAVENOUS
  Filled 2015-09-21: qty 200

## 2015-09-21 MED ORDER — ROCURONIUM BROMIDE 50 MG/5ML IV SOLN
INTRAVENOUS | Status: AC
  Filled 2015-09-21: qty 1

## 2015-09-21 MED ORDER — HYDROMORPHONE HCL 2 MG PO TABS
ORAL_TABLET | ORAL | Status: AC
  Filled 2015-09-21: qty 2

## 2015-09-21 MED ORDER — PROPOFOL 10 MG/ML IV BOLUS
INTRAVENOUS | Status: AC
  Filled 2015-09-21: qty 20

## 2015-09-21 SURGICAL SUPPLY — 59 items
ATCH SMKEVC FLXB CAUT HNDSWH (FILTER) ×1 IMPLANT
BINDER BREAST LRG (GAUZE/BANDAGES/DRESSINGS) IMPLANT
BINDER BREAST XLRG (GAUZE/BANDAGES/DRESSINGS) IMPLANT
BINDER BREAST XXLRG (GAUZE/BANDAGES/DRESSINGS) ×3 IMPLANT
BLADE 10 SAFETY STRL DISP (BLADE) ×3 IMPLANT
BNDG ELASTIC 6X10 VLCR STRL LF (GAUZE/BANDAGES/DRESSINGS) ×3 IMPLANT
CANISTER SUCTION 2500CC (MISCELLANEOUS) ×3 IMPLANT
CHLORAPREP W/TINT 26ML (MISCELLANEOUS) ×3 IMPLANT
CLOSURE WOUND 1/2 X4 (GAUZE/BANDAGES/DRESSINGS)
COTTONBALL LRG STERILE PKG (GAUZE/BANDAGES/DRESSINGS) ×6 IMPLANT
COVER SURGICAL LIGHT HANDLE (MISCELLANEOUS) ×3 IMPLANT
DERMABOND ADVANCED (GAUZE/BANDAGES/DRESSINGS) ×4
DERMABOND ADVANCED .7 DNX12 (GAUZE/BANDAGES/DRESSINGS) ×2 IMPLANT
DRAPE ORTHO SPLIT 77X108 STRL (DRAPES) ×4
DRAPE PROXIMA HALF (DRAPES) ×6 IMPLANT
DRAPE SURG ORHT 6 SPLT 77X108 (DRAPES) ×2 IMPLANT
DRAPE WARM FLUID 44X44 (DRAPE) ×3 IMPLANT
DRSG PAD ABDOMINAL 8X10 ST (GAUZE/BANDAGES/DRESSINGS) ×6 IMPLANT
ELECT CAUTERY BLADE 6.4 (BLADE) ×3 IMPLANT
ELECT REM PT RETURN 9FT ADLT (ELECTROSURGICAL) ×3
ELECTRODE REM PT RTRN 9FT ADLT (ELECTROSURGICAL) ×1 IMPLANT
EVACUATOR SMOKE ACCUVAC VALLEY (FILTER) ×2
GAUZE XEROFORM 5X9 LF (GAUZE/BANDAGES/DRESSINGS) ×6 IMPLANT
GLOVE BIO SURGEON STRL SZ 6.5 (GLOVE) ×4 IMPLANT
GLOVE BIO SURGEON STRL SZ7.5 (GLOVE) ×3 IMPLANT
GLOVE BIO SURGEONS STRL SZ 6.5 (GLOVE) ×2
GLOVE BIOGEL PI IND STRL 6.5 (GLOVE) ×4 IMPLANT
GLOVE BIOGEL PI IND STRL 7.5 (GLOVE) ×1 IMPLANT
GLOVE BIOGEL PI IND STRL 8 (GLOVE) ×1 IMPLANT
GLOVE BIOGEL PI INDICATOR 6.5 (GLOVE) ×8
GLOVE BIOGEL PI INDICATOR 7.5 (GLOVE) ×2
GLOVE BIOGEL PI INDICATOR 8 (GLOVE) ×2
GLOVE ECLIPSE 7.5 STRL STRAW (GLOVE) ×3 IMPLANT
GLOVE SS BIOGEL STRL SZ 6.5 (GLOVE) ×2 IMPLANT
GLOVE SUPERSENSE BIOGEL SZ 6.5 (GLOVE) ×4
GOWN STRL REUS W/ TWL LRG LVL3 (GOWN DISPOSABLE) ×5 IMPLANT
GOWN STRL REUS W/ TWL XL LVL3 (GOWN DISPOSABLE) ×1 IMPLANT
GOWN STRL REUS W/TWL LRG LVL3 (GOWN DISPOSABLE) ×10
GOWN STRL REUS W/TWL XL LVL3 (GOWN DISPOSABLE) ×2
KIT BASIN OR (CUSTOM PROCEDURE TRAY) ×3 IMPLANT
KIT ROOM TURNOVER OR (KITS) ×3 IMPLANT
MARKER SKIN DUAL TIP RULER LAB (MISCELLANEOUS) ×3 IMPLANT
NS IRRIG 1000ML POUR BTL (IV SOLUTION) ×6 IMPLANT
PACK GENERAL/GYN (CUSTOM PROCEDURE TRAY) ×3 IMPLANT
PAD ARMBOARD 7.5X6 YLW CONV (MISCELLANEOUS) ×3 IMPLANT
PREFILTER EVAC NS 1 1/3-3/8IN (MISCELLANEOUS) ×3 IMPLANT
SPONGE GAUZE 4X4 12PLY STER LF (GAUZE/BANDAGES/DRESSINGS) ×6 IMPLANT
SPONGE LAP 18X18 X RAY DECT (DISPOSABLE) IMPLANT
STAPLER VISISTAT 35W (STAPLE) ×3 IMPLANT
STRIP CLOSURE SKIN 1/2X4 (GAUZE/BANDAGES/DRESSINGS) IMPLANT
SUT MNCRL AB 3-0 PS2 18 (SUTURE) ×12 IMPLANT
SUT MNCRL AB 4-0 PS2 18 (SUTURE) ×6 IMPLANT
SUT MON AB 2-0 CT1 36 (SUTURE) ×12 IMPLANT
SUT PROLENE 5 0 PS 2 (SUTURE) ×6 IMPLANT
SUT SILK 4 0 PS 2 (SUTURE) ×18 IMPLANT
TOWEL OR 17X24 6PK STRL BLUE (TOWEL DISPOSABLE) ×3 IMPLANT
TOWEL OR 17X26 10 PK STRL BLUE (TOWEL DISPOSABLE) ×3 IMPLANT
TUBE CONNECTING 12'X1/4 (SUCTIONS) ×1
TUBE CONNECTING 12X1/4 (SUCTIONS) ×2 IMPLANT

## 2015-09-21 NOTE — Progress Notes (Signed)
Pt status changed to "restricted" per patient request.

## 2015-09-21 NOTE — Brief Op Note (Signed)
09/21/2015  10:27 AM  PATIENT:  Kristen Harris  54 y.o. female  PRE-OPERATIVE DIAGNOSIS:  BILATERAL BREAST HYPERTROPHY  POST-OPERATIVE DIAGNOSIS:  BILATERAL BREAST HYPERTROPHY  PROCEDURE:  Procedure(s): BILATERAL BREAST REDUCTION USING FREE NIPPLE GRAFT (Bilateral)  SURGEON:  Surgeon(s) and Role:    * Crissie Reese, MD - Primary  PHYSICIAN ASSISTANT:   ASSISTANTS: Judyann Munson, RNFA   ANESTHESIA:   general  EBL:  Total I/O In: 1000 [I.V.:1000] Out: -   BLOOD ADMINISTERED:none  DRAINS: none   LOCAL MEDICATIONS USED:  NONE  SPECIMEN:  Source of Specimen:  Bilateral breast  DISPOSITION OF SPECIMEN:  PATHOLOGY  COUNTS:  YES  TOURNIQUET:  * No tourniquets in log *  DICTATION: .Other Dictation: Dictation Number [id number]  PLAN OF CARE: Admit for overnight observation  PATIENT DISPOSITION:  PACU - hemodynamically stable.   Delay start of Pharmacological VTE agent (>24hrs) due to surgical blood loss or risk of bleeding: not applicable

## 2015-09-21 NOTE — Transfer of Care (Signed)
Immediate Anesthesia Transfer of Care Note  Patient: Kristen Harris  Procedure(s) Performed: Procedure(s): BILATERAL BREAST REDUCTION USING FREE NIPPLE GRAFT (Bilateral)  Patient Location: PACU  Anesthesia Type:General  Level of Consciousness: awake, alert , oriented and patient cooperative  Airway & Oxygen Therapy: Patient Spontanous Breathing and Patient connected to nasal cannula oxygen  Post-op Assessment: Report given to RN and Post -op Vital signs reviewed and stable  Post vital signs: Reviewed and stable  Last Vitals:  Filed Vitals:   09/21/15 1032  BP:   Pulse:   Temp: 36.8 C  Resp:     Complications: No apparent anesthesia complications

## 2015-09-21 NOTE — H&P (Signed)
I have re-examined and re-evaluated the patient and there are no changes.  See office notes in paper chart for H&P. 

## 2015-09-21 NOTE — Anesthesia Postprocedure Evaluation (Signed)
  Anesthesia Post-op Note  Patient: Kristen Harris  Procedure(s) Performed: Procedure(s): BILATERAL BREAST REDUCTION USING FREE NIPPLE GRAFT (Bilateral)  Patient Location: PACU  Anesthesia Type:General  Level of Consciousness: awake and alert   Airway and Oxygen Therapy: Patient Spontanous Breathing  Post-op Pain: Controlled  Post-op Assessment: Post-op Vital signs reviewed, Patient's Cardiovascular Status Stable and Respiratory Function Stable  Post-op Vital Signs: Reviewed  Filed Vitals:   09/21/15 1154  BP:   Pulse: 94  Temp:   Resp: 18    Complications: No apparent anesthesia complications

## 2015-09-21 NOTE — Progress Notes (Signed)
Vancomycin for surgical prophylaxis per pharmacy ordered.  Patient is to be admitted for overnight observation.  Pre-op dose of vancomycin 1g IV x1 given at 0708 this morning.  SCr 0.71, CrCl ~95-121mL/min.  Plan: -vancomycin 1g IV x1 to be given tonight at 1830 -pharmacy to sign off since prophylaxis does not continue for longer than 24h  Malini Flemings D. Eligh Rybacki, PharmD, BCPS Clinical Pharmacist Pager: [phone] 09/21/2015 1:10 PM

## 2015-09-21 NOTE — Anesthesia Preprocedure Evaluation (Addendum)
Anesthesia Evaluation  Patient identified by MRN, date of birth, ID band Patient awake    Reviewed: Allergy & Precautions, H&P , NPO status , Patient's Chart, lab work & pertinent test results  Airway Mallampati: II  TM Distance: >3 FB Neck ROM: Full    Dental no notable dental hx. (+) Edentulous Upper, Edentulous Lower, Dental Advisory Given   Pulmonary asthma , sleep apnea , former smoker,    Pulmonary exam normal breath sounds clear to auscultation       Cardiovascular hypertension, Pt. on medications negative cardio ROS   Rhythm:Regular Rate:Normal     Neuro/Psych CVA negative psych ROS   GI/Hepatic Neg liver ROS, GERD  Medicated and Controlled,  Endo/Other  diabetes, Type 2, Oral Hypoglycemic AgentsMorbid obesity  Renal/GU negative Renal ROS  negative genitourinary   Musculoskeletal  (+) Fibromyalgia -  Abdominal   Peds  Hematology negative hematology ROS (+)   Anesthesia Other Findings   Reproductive/Obstetrics negative OB ROS                            Anesthesia Physical Anesthesia Plan  ASA: III  Anesthesia Plan: General   Post-op Pain Management:    Induction: Intravenous  Airway Management Planned: Oral ETT  Additional Equipment:   Intra-op Plan:   Post-operative Plan: Extubation in OR  Informed Consent: I have reviewed the patients History and Physical, chart, labs and discussed the procedure including the risks, benefits and alternatives for the proposed anesthesia with the patient or authorized representative who has indicated his/her understanding and acceptance.   Dental advisory given  Plan Discussed with: CRNA  Anesthesia Plan Comments:         Anesthesia Quick Evaluation

## 2015-09-22 ENCOUNTER — Encounter (HOSPITAL_COMMUNITY): Payer: Self-pay | Admitting: Plastic Surgery

## 2015-09-22 DIAGNOSIS — N62 Hypertrophy of breast: Secondary | ICD-10-CM | POA: Diagnosis not present

## 2015-09-22 LAB — GLUCOSE, CAPILLARY
GLUCOSE-CAPILLARY: 119 mg/dL — AB (ref 65–99)
GLUCOSE-CAPILLARY: 121 mg/dL — AB (ref 65–99)

## 2015-09-22 MED ORDER — DOCUSATE SODIUM 100 MG PO CAPS: 100.0000 mg | ORAL_CAPSULE | Freq: Every day | ORAL | Status: AC

## 2015-09-22 MED ORDER — METHOCARBAMOL 500 MG PO TABS: 500.0000 mg | ORAL_TABLET | Freq: Four times a day (QID) | ORAL | Status: DC | PRN

## 2015-09-22 MED ORDER — HYDROMORPHONE HCL 2 MG PO TABS: 2.0000 mg | ORAL_TABLET | ORAL | Status: DC | PRN

## 2015-09-22 MED ORDER — FONDAPARINUX SODIUM 2.5 MG/0.5ML ~~LOC~~ SOLN
2.5000 mg | Freq: Once | SUBCUTANEOUS | Status: AC
  Administered 2015-09-22: 2.5 mg via SUBCUTANEOUS
  Filled 2015-09-22: qty 0.5

## 2015-09-22 NOTE — Discharge Instructions (Addendum)
No lifting for 6 weeks No vigorous activity for 6 weeks (including outdoor walks) No driving for 4 weeks OK to walk up stairs slowly Stay propped up Use incentive spirometer at home every hour while awake No shower until instructed by Dr. Harlow Mares Take an over-the-counter stool softener (such as Colace) while on pain medication See Dr. Harlow Mares in office next week For questions call [phone] or [phone]

## 2015-09-22 NOTE — Progress Notes (Signed)
Discharge instructions given. Pt verbalized understanding and all questions were answered. Pt's ride will be here at 4pm to take her home.

## 2015-09-22 NOTE — Op Note (Signed)
NAME:  Kristen Harris, DETORE            ACCOUNT NO.:  [id number]  MEDICAL RECORD NO.:  [id number]  LOCATION:  6N09C                        FACILITY:  [facility]  PHYSICIAN:  Crissie Reese, M.D.     DATE OF BIRTH:  [birth year]  DATE OF PROCEDURE:  09/21/2015 DATE OF DISCHARGE:                              OPERATIVE REPORT   PREOPERATIVE DIAGNOSIS:  Bilateral macromastia with upper back pain, neck pain, shoulder pain.  POSTOPERATIVE DIAGNOSIS:  Bilateral macromastia with upper back pain, neck pain, shoulder pain.  PROCEDURE PERFORMED:  Bilateral breast reduction using free nipple graft technique.  SURGEON:  Crissie Reese, M.D.  ASSISTANT:  Judyann Munson, RNFA.  ANESTHESIA:  General.  ESTIMATED BLOOD LOSS:  50 mL.  CLINICAL NOTE:  A 54 year old woman has complaints of very large breasts with upper back pain, neck pain, shoulder pain, and bra strap shoulder grooving.  She desired a breast reduction.  She has significant asymmetry between the breasts.  Due to the measurements for the nipple notch distance, it was felt that a free nipple graft would be best.  She understood that this would certainly lead to loss of sensation of the nipples as well as quite possibly loss of significant amount of pigment and color in the nipples.  She also understood other risks that include, but not limited to, bleeding, infection, healing problems, scarring, loss of sensation, fluid accumulations, loss of tissue, loss of the nipple grafts, loss of skin, and she is at increased risk for healing problems due to diabetes, anesthesia-related complications, DVT, PE, asymmetry, chronic pain, failure to relieve symptoms, and overall disappointment.  She understood all of this and wished to proceed.  DESCRIPTION OF PROCEDURE:  The patient was placed in a full standing position in the holding area and marked for the bilateral breast reduction.  She was then taken to the operating room and placed supine. After  successful induction of general anesthesia, she was prepped with ChloraPrep and after waiting the full 3 minutes for drying, she was draped with sterile drapes.  A 42 mm marker marked the nipple-areolar complexes and these were harvested and labeled separately left and right.  The central area was de-epithelialized and then incision was made and undermining was performed from the inferior aspect in a cephalad direction for a short distance.  The inferior pole of the breast was then amputated bilateral.  A total of 760 g from the smaller right side and 1024 g from the larger left side.  This seemed to give her fairly good symmetry in terms of volume.  Thorough irrigation with saline and meticulous hemostasis with electrocautery.  The closure was with 2-0 Monocryl interrupted inverted deep dermal sutures, and 3-0 Monocryl running subcuticular suture.  A measurement was then taken 5 cm up from the inframammary crease and the 42 mm marker marked the site for the nipple-areolar complex.  This site was then de-epithelialized.  The nipple grafts were then prepared and care was taken to make sure that the left was placed back to the left and the right on the right.  The nipple grafts were defatted, they were applied to the wound beds, and secured with 4-0 silk simple interrupted sutures around the  periphery leaving 1 tail long for the tie-over bolster.  Flushing of the nipple bed was then performed with an Asepto using saline and then a 5-0 Prolene simple suture was run around the periphery.  Xeroform gauze with 2 saline soaked cotton balls were then placed and the 4-0 silk sutures were tied over as a bolster dressing over the nipple grafts.  Dermabond was applied to the wound, dry sterile dressings, and circumferential Ace wrap and the breast binder and she was transferred to the recovery room stable having tolerated the procedure well.  DISPOSITION:  She will be observed  overnight.     Crissie Reese, M.D.     DB/MEDQ  D:  09/21/2015  T:  09/22/2015  Job:  [id number]  cc:   Crissie Reese, M.D.

## 2015-09-22 NOTE — Discharge Summary (Signed)
Physician Discharge Summary  Patient ID: Kristen Harris MRN: [id number] DOB/AGE: 12-13-1960 54 y.o.  Admit date: 09/21/2015 Discharge date: 09/22/2015  Admission Diagnoses: Breast hypertrophy. Diabetes Type II  Discharge Diagnoses: Same Active Problems:   Breast hypertrophy   Discharged Condition: good  Hospital Course: On the day of admission the patient was taken to surgery and had bilateral breast reduction. The patient tolerated the procedures well. Postoperatively, the patient was ambulatory and tolerating diet on the first postoperative day. CBG is okay this am.  Treatments: antibiotics: vancomycin and cipro, anticoagulation: arixtra and surgery: bilateral breast reduction  Discharge Exam: Blood pressure 122/65, pulse 95, temperature 99 F (37.2 C), temperature source Oral, resp. rate 18, height 5\' 6"  (1.676 m), weight 222 lb (100.699 kg), SpO2 96 %.  Operative sites: Chest is soft. No evidence of bleeding or infection on either side.  Disposition: 01-Home or Self Care     Medication List    STOP taking these medications        zolpidem 5 MG tablet  Commonly known as:  AMBIEN      TAKE these medications        acetaminophen 500 MG tablet  Commonly known as:  TYLENOL  Take 1,000 mg by mouth every 8 (eight) hours as needed for mild pain or moderate pain.     albuterol 108 (90 BASE) MCG/ACT inhaler  Commonly known as:  PROVENTIL HFA;VENTOLIN HFA  Inhale 2 puffs into the lungs every 6 (six) hours as needed for wheezing or shortness of breath.     B-12 PO  Take 1 mL by mouth daily.     Calcium Carb-Cholecalciferol 600-800 MG-UNIT Chew  Chew 1 tablet by mouth 2 (two) times daily.     docusate sodium 100 MG capsule  Commonly known as:  COLACE  Take 1 capsule (100 mg total) by mouth daily.     DULoxetine 60 MG capsule  Commonly known as:  CYMBALTA  Take 60 mg by mouth daily.     HYDROmorphone 2 MG tablet  Commonly known as:  DILAUDID  Take 1-2 tablets  (2-4 mg total) by mouth every 4 (four) hours as needed for moderate pain.     JANUVIA 100 MG tablet  Generic drug:  sitaGLIPtin  Take 1 tablet by mouth daily.     LEVEMIR FLEXTOUCH 100 UNIT/ML Pen  Generic drug:  Insulin Detemir  Inject 42 Units into the skin at bedtime.     lisinopril 10 MG tablet  Commonly known as:  PRINIVIL,ZESTRIL  Take 1 tablet by mouth daily.     methocarbamol 500 MG tablet  Commonly known as:  ROBAXIN  Take 1 tablet (500 mg total) by mouth every 6 (six) hours as needed for muscle spasms.     omeprazole 20 MG capsule  Commonly known as:  PRILOSEC  Take 1 capsule by mouth daily.     triamcinolone cream 0.1 %  Commonly known as:  KENALOG  Apply 1 application topically 2 (two) times daily as needed. rash         Signed: Crissie Reese M 09/22/2015, 8:20 AM

## 2015-10-31 ENCOUNTER — Ambulatory Visit: Payer: Commercial Managed Care - HMO | Attending: Internal Medicine

## 2015-12-14 ENCOUNTER — Ambulatory Visit: Payer: Commercial Managed Care - HMO | Attending: Internal Medicine

## 2015-12-14 DIAGNOSIS — G4733 Obstructive sleep apnea (adult) (pediatric): Secondary | ICD-10-CM | POA: Insufficient documentation

## 2015-12-14 DIAGNOSIS — I10 Essential (primary) hypertension: Secondary | ICD-10-CM | POA: Insufficient documentation

## 2015-12-14 DIAGNOSIS — R0683 Snoring: Secondary | ICD-10-CM | POA: Diagnosis present

## 2015-12-19 ENCOUNTER — Other Ambulatory Visit: Payer: Self-pay | Admitting: Physician Assistant

## 2015-12-19 DIAGNOSIS — R1011 Right upper quadrant pain: Secondary | ICD-10-CM

## 2015-12-29 ENCOUNTER — Ambulatory Visit
Admission: RE | Admit: 2015-12-29 | Discharge: 2015-12-29 | Disposition: A | Discharge status: Home / Self Care | Payer: Commercial Managed Care - HMO | Source: Ambulatory Visit | Attending: Physician Assistant | Admitting: Physician Assistant

## 2015-12-29 DIAGNOSIS — R1011 Right upper quadrant pain: Secondary | ICD-10-CM | POA: Diagnosis present

## 2015-12-29 DIAGNOSIS — R112 Nausea with vomiting, unspecified: Secondary | ICD-10-CM | POA: Insufficient documentation

## 2016-01-03 ENCOUNTER — Other Ambulatory Visit: Payer: Self-pay | Admitting: Physician Assistant

## 2016-01-03 DIAGNOSIS — R112 Nausea with vomiting, unspecified: Secondary | ICD-10-CM

## 2016-01-03 DIAGNOSIS — R1011 Right upper quadrant pain: Secondary | ICD-10-CM

## 2016-01-10 DIAGNOSIS — N62 Hypertrophy of breast: Secondary | ICD-10-CM | POA: Insufficient documentation

## 2016-01-15 ENCOUNTER — Ambulatory Visit: Payer: Commercial Managed Care - HMO

## 2016-04-16 ENCOUNTER — Encounter: Payer: Self-pay | Admitting: Medical Oncology

## 2016-04-16 ENCOUNTER — Emergency Department
Admission: EM | Admit: 2016-04-16 | Discharge: 2016-04-16 | Disposition: A | Discharge status: Home / Self Care | Payer: Self-pay | Attending: Emergency Medicine | Admitting: Emergency Medicine

## 2016-04-16 ENCOUNTER — Emergency Department: Payer: Self-pay

## 2016-04-16 DIAGNOSIS — Z7951 Long term (current) use of inhaled steroids: Secondary | ICD-10-CM | POA: Insufficient documentation

## 2016-04-16 DIAGNOSIS — Z8673 Personal history of transient ischemic attack (TIA), and cerebral infarction without residual deficits: Secondary | ICD-10-CM | POA: Insufficient documentation

## 2016-04-16 DIAGNOSIS — R590 Localized enlarged lymph nodes: Secondary | ICD-10-CM | POA: Insufficient documentation

## 2016-04-16 DIAGNOSIS — Z79899 Other long term (current) drug therapy: Secondary | ICD-10-CM | POA: Insufficient documentation

## 2016-04-16 DIAGNOSIS — J45909 Unspecified asthma, uncomplicated: Secondary | ICD-10-CM | POA: Insufficient documentation

## 2016-04-16 DIAGNOSIS — E119 Type 2 diabetes mellitus without complications: Secondary | ICD-10-CM | POA: Insufficient documentation

## 2016-04-16 DIAGNOSIS — E785 Hyperlipidemia, unspecified: Secondary | ICD-10-CM | POA: Insufficient documentation

## 2016-04-16 DIAGNOSIS — I10 Essential (primary) hypertension: Secondary | ICD-10-CM | POA: Insufficient documentation

## 2016-04-16 DIAGNOSIS — Z87891 Personal history of nicotine dependence: Secondary | ICD-10-CM | POA: Insufficient documentation

## 2016-04-16 DIAGNOSIS — J040 Acute laryngitis: Secondary | ICD-10-CM | POA: Insufficient documentation

## 2016-04-16 LAB — CBC WITH DIFFERENTIAL/PLATELET
Basophils Absolute: 0 10*3/uL (ref 0–0.1)
Basophils Relative: 0 %
Eosinophils Absolute: 0.3 10*3/uL (ref 0–0.7)
Eosinophils Relative: 2 %
HCT: 37.4 % (ref 35.0–47.0)
HEMOGLOBIN: 12.7 g/dL (ref 12.0–16.0)
Lymphs Abs: 2.6 10*3/uL (ref 1.0–3.6)
MCH: 28 pg (ref 26.0–34.0)
MCHC: 34 g/dL (ref 32.0–36.0)
MCV: 82.5 fL (ref 80.0–100.0)
Monocytes Absolute: 0.9 10*3/uL (ref 0.2–0.9)
Neutro Abs: 8.3 10*3/uL — ABNORMAL HIGH (ref 1.4–6.5)
Neutrophils Relative %: 69 %
Platelets: 226 10*3/uL (ref 150–440)
RBC: 4.54 MIL/uL (ref 3.80–5.20)
RDW: 14.4 % (ref 11.5–14.5)
WBC: 12.2 10*3/uL — ABNORMAL HIGH (ref 3.6–11.0)

## 2016-04-16 LAB — COMPREHENSIVE METABOLIC PANEL
ALK PHOS: 76 U/L (ref 38–126)
ALT: 30 U/L (ref 14–54)
AST: 27 U/L (ref 15–41)
Albumin: 3.9 g/dL (ref 3.5–5.0)
Anion gap: 6 (ref 5–15)
BILIRUBIN TOTAL: 0.6 mg/dL (ref 0.3–1.2)
BUN: 7 mg/dL (ref 6–20)
CALCIUM: 9 mg/dL (ref 8.9–10.3)
CO2: 27 mmol/L (ref 22–32)
CREATININE: 0.61 mg/dL (ref 0.44–1.00)
Chloride: 104 mmol/L (ref 101–111)
Glucose, Bld: 115 mg/dL — ABNORMAL HIGH (ref 65–99)
Potassium: 3.9 mmol/L (ref 3.5–5.1)
Sodium: 137 mmol/L (ref 135–145)
TOTAL PROTEIN: 7.7 g/dL (ref 6.5–8.1)

## 2016-04-16 LAB — LIPASE, BLOOD: LIPASE: 27 U/L (ref 11–51)

## 2016-04-16 LAB — POCT RAPID STREP A: STREPTOCOCCUS, GROUP A SCREEN (DIRECT): NEGATIVE

## 2016-04-16 MED ORDER — ONDANSETRON HCL 4 MG PO TABS: 4.0000 mg | ORAL_TABLET | Freq: Three times a day (TID) | ORAL | Status: AC | PRN

## 2016-04-16 MED ORDER — CLINDAMYCIN HCL 300 MG PO CAPS: 300.0000 mg | ORAL_CAPSULE | Freq: Three times a day (TID) | ORAL | Status: DC

## 2016-04-16 MED ORDER — SODIUM CHLORIDE 0.9 % IV BOLUS (SEPSIS)
1000.0000 mL | Freq: Once | INTRAVENOUS | Status: AC
  Administered 2016-04-16: 1000 mL via INTRAVENOUS

## 2016-04-16 MED ORDER — ONDANSETRON HCL 4 MG/2ML IJ SOLN
4.0000 mg | Freq: Once | INTRAMUSCULAR | Status: AC
  Administered 2016-04-16: 4 mg via INTRAVENOUS
  Filled 2016-04-16: qty 2

## 2016-04-16 MED ORDER — IOPAMIDOL (ISOVUE-300) INJECTION 61%
75.0000 mL | Freq: Once | INTRAVENOUS | Status: AC | PRN
  Administered 2016-04-16: 75 mL via INTRAVENOUS

## 2016-04-16 MED ORDER — CLINDAMYCIN PHOSPHATE 600 MG/50ML IV SOLN
600.0000 mg | Freq: Once | INTRAVENOUS | Status: AC
  Administered 2016-04-16: 600 mg via INTRAVENOUS
  Filled 2016-04-16: qty 50

## 2016-04-16 MED ORDER — DEXAMETHASONE SODIUM PHOSPHATE 10 MG/ML IJ SOLN
10.0000 mg | Freq: Once | INTRAMUSCULAR | Status: AC
  Administered 2016-04-16: 10 mg via INTRAVENOUS
  Filled 2016-04-16: qty 1

## 2016-04-16 NOTE — ED Notes (Signed)
Husband at bedside  Pt resting

## 2016-04-16 NOTE — ED Notes (Signed)
Pt reports nausea and vomiting since Sunday with body aches.

## 2016-04-16 NOTE — ED Notes (Signed)
Pt states she has been nauseated for a few days now, denies diarrhea, husband at bedside.

## 2016-04-16 NOTE — ED Notes (Signed)
Pt alert and oriented X4, active, cooperative, pt in NAD. RR even and unlabored, color WNL.  Pt informed to return if any life threatening symptoms occur.   

## 2016-04-16 NOTE — ED Provider Notes (Signed)
CSN: [id number]     Arrival date & time 04/16/16  [id number] History   First MD Initiated Contact with Patient 04/16/16 [phone]     Chief Complaint  Patient presents with  . Emesis     (Consider location/radiation/quality/duration/timing/severity/associated sxs/prior Treatment) The history is provided by the patient.  Kristen Harris is a 55 y.o. female hx of DM, HL, HTN, here with cough, vomiting, sore throat. Patient went to [place] to be with family this past weekend. States that she developed sore throat for the last 2-3 days. She also has a productive cough as well with yellowish sputum. Has some subjective chills but no fever. Also has numerous episodes of vomiting but denies any diarrhea. Has some epigastric pain when she vomits. Denies any urinary symptoms or any chest pain. She states that she has her voice has changed and she had some trouble swallowing.    Past Medical History  Diagnosis Date  . Asthma   . Sleep apnea   . Diabetes mellitus without complication ([place])     type 2  . Eczema   . Sickle cell trait ([place])   . Hyperlipidemia   . Hypertriglyceridemia   . Stroke ([place])   . Hypertension   . GERD (gastroesophageal reflux disease)   . Fibromyalgia   . Shortness of breath dyspnea   . History of pneumonia   . History of bronchitis   . Urinary frequency   . Anemia    Past Surgical History  Procedure Laterality Date  . Colonoscopy with propofol N/A 04/21/2015    Procedure: COLONOSCOPY WITH PROPOFOL;  Surgeon: Josefine Class, MD;  Location: [facility] ENDOSCOPY;  Service: Endoscopy;  Laterality: N/A;  . Abdominal hysterectomy    . Diagnostic laparoscopy    . Cesarean section    . Breast reduction surgery Bilateral 09/21/2015  . Breast reduction surgery Bilateral 09/21/2015    Procedure: BILATERAL BREAST REDUCTION USING FREE NIPPLE GRAFT;  Surgeon: Crissie Reese, MD;  Location: [facility];  Service: Plastics;  Laterality: Bilateral;   Family History  Problem Relation Age of  Onset  . Breast cancer Mother 4   Social History  Substance Use Topics  . Smoking status: Former Smoker    Quit date: 07/17/2009  . Smokeless tobacco: Never Used  . Alcohol Use: Yes     Comment: socially   OB History    No data available     Review of Systems  HENT: Positive for sore throat.   Respiratory: Positive for cough.   Gastrointestinal: Positive for vomiting.  All other systems reviewed and are negative.     Allergies  Aspirin; Erythromycin; Morphine and related; Orange fruit; Penicillins; Pork-derived products; Tetracyclines & related; and Tomato  Home Medications   Prior to Admission medications   Medication Sig Start Date End Date Taking? Authorizing Provider  acetaminophen (TYLENOL) 500 MG tablet Take 1,000 mg by mouth every 8 (eight) hours as needed for mild pain or moderate pain.    Historical Provider, MD  albuterol (PROVENTIL HFA;VENTOLIN HFA) 108 (90 BASE) MCG/ACT inhaler Inhale 2 puffs into the lungs every 6 (six) hours as needed for wheezing or shortness of breath.    Historical Provider, MD  Calcium Carb-Cholecalciferol 600-800 MG-UNIT CHEW Chew 1 tablet by mouth 2 (two) times daily.    Historical Provider, MD  Cyanocobalamin (B-12 PO) Take 1 mL by mouth daily.     Historical Provider, MD  docusate sodium (COLACE) 100 MG capsule Take 1 capsule (100 mg total) by  mouth daily. 09/22/15   Crissie Reese, MD  DULoxetine (CYMBALTA) 60 MG capsule Take 60 mg by mouth daily. 03/22/15   Historical Provider, MD  HYDROmorphone (DILAUDID) 2 MG tablet Take 1-2 tablets (2-4 mg total) by mouth every 4 (four) hours as needed for moderate pain. 09/22/15   Crissie Reese, MD  JANUVIA 100 MG tablet Take 1 tablet by mouth daily. 04/02/15   Historical Provider, MD  LEVEMIR FLEXTOUCH 100 UNIT/ML Pen Inject 42 Units into the skin at bedtime.  03/17/15   Historical Provider, MD  lisinopril (PRINIVIL,ZESTRIL) 10 MG tablet Take 1 tablet by mouth daily. 03/17/15   Historical Provider, MD   methocarbamol (ROBAXIN) 500 MG tablet Take 1 tablet (500 mg total) by mouth every 6 (six) hours as needed for muscle spasms. 09/22/15   Crissie Reese, MD  omeprazole (PRILOSEC) 20 MG capsule Take 1 capsule by mouth daily. 04/04/15   Historical Provider, MD  triamcinolone cream (KENALOG) 0.1 % Apply 1 application topically 2 (two) times daily as needed. rash 01/12/15   Historical Provider, MD   BP 120/73 mmHg  Pulse 78  Temp(Src) 98.8 F (37.1 C) (Oral)  Resp 16  Ht 5\' 6"  (1.676 m)  Wt 210 lb (95.255 kg)  BMI 33.91 kg/m2  SpO2 99% Physical Exam  Constitutional: She is oriented to person, place, and time.  Coughing   HENT:  Head: Normocephalic.  OP slightly red, no tonsillar exudates, uvula midline   Eyes: Conjunctivae are normal. Pupils are equal, round, and reactive to light.  Neck:  + bilateral tender lymphadenopathy. No meningeal signs, no stridor. Muffled voice   Cardiovascular: Normal rate, regular rhythm and normal heart sounds.   Pulmonary/Chest: Effort normal and breath sounds normal. No respiratory distress. She has no wheezes. She has no rales.  Abdominal: Soft. Bowel sounds are normal. She exhibits no distension. There is no tenderness. There is no rebound.  Musculoskeletal: Normal range of motion. She exhibits no edema or tenderness.  Neurological: She is alert and oriented to person, place, and time.  Skin: Skin is warm and dry.  Psychiatric: She has a normal mood and affect. Her behavior is normal. Judgment and thought content normal.  Nursing note and vitals reviewed.   ED Course  Procedures (including critical care time) Labs Review Labs Reviewed  CBC WITH DIFFERENTIAL/PLATELET - Abnormal; Notable for the following:    WBC 12.2 (*)    Neutro Abs 8.3 (*)    All other components within normal limits  COMPREHENSIVE METABOLIC PANEL - Abnormal; Notable for the following:    Glucose, Bld 115 (*)    All other components within normal limits  CULTURE, BETA STREP (GROUP  B ONLY)  LIPASE, BLOOD  URINALYSIS COMPLETEWITH MICROSCOPIC ([facility] ONLY)    Imaging Review Dg Neck Soft Tissue  04/16/2016  CLINICAL DATA:  Cough and congestion.  Sore throat.  Swollen glands. EXAM: NECK SOFT TISSUES - 1+ VIEW COMPARISON:  None. FINDINGS: Straightening of the cervical spine with disc space narrowing at C3 through C6. Evidence for bilateral cervical facet arthropathy. Prevertebral soft tissues are prominent but not grossly abnormal. There is soft tissue fullness at the base of the tongue and vallecular region. There may also be mild fullness of the epiglottis. No gross abnormality to the airway or trachea. The upper lungs are clear. IMPRESSION: Soft tissue fullness at the base of the tongue and valleculae region. Findings could be associated soft tissue swelling. This area could be better characterized with a neck CT with  IV contrast. Multilevel degenerative changes in cervical spine. Electronically Signed   By: Markus Daft M.D.   On: 04/16/2016 09:32   Dg Chest 2 View  04/16/2016  CLINICAL DATA:  Cough. EXAM: CHEST  2 VIEW COMPARISON:  None. FINDINGS: The heart size and mediastinal contours are within normal limits. No pneumothorax or pleural effusion is noted. Mild diffuse interstitial densities are noted throughout both lungs concerning for pulmonary edema or inflammation. The visualized skeletal structures are unremarkable. IMPRESSION: Mild diffuse interstitial densities are noted throughout both lungs concerning for edema or less likely inflammation. Electronically Signed   By: Marijo Conception, M.D.   On: 04/16/2016 09:22   Ct Soft Tissue Neck W Contrast  04/16/2016  CLINICAL DATA:  Sore throat and laryngitis for 2 days. EXAM: CT NECK WITH CONTRAST TECHNIQUE: Multidetector CT imaging of the neck was performed using the standard protocol following the bolus administration of intravenous contrast. CONTRAST:  58mL ISOVUE-300 IOPAMIDOL (ISOVUE-300) INJECTION 61% COMPARISON:  Soft tissue  neck radiographs 04/16/2016. FINDINGS: Pharynx and larynx: There is moderate fullness of the palatine tonsils and lingual tonsils diffusely without a discrete lesion. The valleculae is patent. The epiglottis is within normal limits. Vocal cords are midline and symmetric. Salivary glands: The parotid and submandibular glands are within normal limits bilaterally. Thyroid: There is a lobulation at the upper pole of the left lobe of thyroid encompassing fat. No discrete nodules are present. Lymph nodes: Multiple sub cm level 2 lymph nodes are present bilaterally, more prominent right than left. These are likely reactive. A right submandibular lymph node measures up to 8.4 cm. Vascular: No significant vascular calcifications or stenosis are present. Limited intracranial: Limited imaging the brain is unremarkable. Visualized orbits: Within normal limits. Mastoids and visualized paranasal sinuses: Minimal mucosal thickening is present along the floor of the maxillary sinuses. The visualized paranasal sinuses and the mastoid air cells are otherwise clear. Skeleton: Multilevel spondylosis is present in the cervical spine. Endplate degenerative changes are most noted from C3-4 through C6-7. No focal lytic or blastic lesions are present. Upper chest: Scarring or atelectasis is present at the right lung apex. No focal nodule, mass, or airspace disease is present otherwise. IMPRESSION: 1. Symmetric fullness of the palatine and lingual tonsils bilaterally. This is likely related to acute inflammation. No discrete mass lesion is present. Direct visualization is recommended. No discrete lesion is evident. 2. Bilateral reactive type upper cervical lymph nodes is described. These are more prominent right than left. There is no evidence for malignancy. 3. Minimal sinus disease is more prominent on the left. 4. Multilevel cervical spondylosis is advanced for age. Electronically Signed   By: San Morelle M.D.   On: 04/16/2016  11:05   I have personally reviewed and evaluated these images and lab results as part of my medical decision-making.   EKG Interpretation None      MDM   Final diagnoses:  None   Particia Hori is a 55 y.o. female here with cough, vomiting, sore throat. Has voice changes. Consider RPA vs viral laryngitis vs pneumonia vs viral gastro. Will get labs, cxr, xray neck, rapid strep. Will hydrate and reassess.   12:36 PM CXR showed interstitial densities but no obvious pneumonia. Neck xray showed vallecula swelling. CT neck showed fullness palatine and lingual tonsils with no obvious mass or RPA. WBC mildly elevated at 12. Likely viral vs early bacterial laryngitis. Afebrile, well appearing, tolerated secretions. No vomiting in the ED. Given clinda IV and decadron. Will  dc home with clinda, zofran, ENT follow up.    Wandra Arthurs, MD 04/16/16 [phone]

## 2016-04-16 NOTE — Discharge Instructions (Signed)
Take clindamycin three times daily for a week.   Stay hydrated.   Take zofran for nausea.   Your neck swelling is likely from a viral infection or early bacterial infection.   Please see ENT doctor in a week if symptoms not improved.   Return to ER if you have worse neck swelling, trouble breathing, shortness of breath, vomiting, dehydration.

## 2016-04-16 NOTE — ED Notes (Signed)
MD at bedside. 

## 2016-04-19 LAB — CULTURE, BETA STREP (GROUP B ONLY)

## 2016-06-18 ENCOUNTER — Other Ambulatory Visit: Payer: Self-pay | Admitting: Physician Assistant

## 2016-06-18 DIAGNOSIS — Z1239 Encounter for other screening for malignant neoplasm of breast: Secondary | ICD-10-CM

## 2016-07-15 ENCOUNTER — Encounter: Payer: Self-pay | Admitting: Hematology and Oncology

## 2016-07-15 ENCOUNTER — Encounter (INDEPENDENT_AMBULATORY_CARE_PROVIDER_SITE_OTHER): Payer: Self-pay

## 2016-07-15 ENCOUNTER — Inpatient Hospital Stay: Payer: 59 | Attending: Hematology and Oncology | Admitting: Hematology and Oncology

## 2016-07-15 ENCOUNTER — Inpatient Hospital Stay: Payer: 59

## 2016-07-15 DIAGNOSIS — Z7984 Long term (current) use of oral hypoglycemic drugs: Secondary | ICD-10-CM

## 2016-07-15 DIAGNOSIS — L309 Dermatitis, unspecified: Secondary | ICD-10-CM

## 2016-07-15 DIAGNOSIS — D573 Sickle-cell trait: Secondary | ICD-10-CM | POA: Diagnosis not present

## 2016-07-15 DIAGNOSIS — R0602 Shortness of breath: Secondary | ICD-10-CM

## 2016-07-15 DIAGNOSIS — R350 Frequency of micturition: Secondary | ICD-10-CM | POA: Diagnosis not present

## 2016-07-15 DIAGNOSIS — Z79899 Other long term (current) drug therapy: Secondary | ICD-10-CM

## 2016-07-15 DIAGNOSIS — Z8701 Personal history of pneumonia (recurrent): Secondary | ICD-10-CM | POA: Diagnosis not present

## 2016-07-15 DIAGNOSIS — D72829 Elevated white blood cell count, unspecified: Secondary | ICD-10-CM | POA: Diagnosis not present

## 2016-07-15 DIAGNOSIS — K219 Gastro-esophageal reflux disease without esophagitis: Secondary | ICD-10-CM | POA: Diagnosis not present

## 2016-07-15 DIAGNOSIS — Z794 Long term (current) use of insulin: Secondary | ICD-10-CM

## 2016-07-15 DIAGNOSIS — Z8673 Personal history of transient ischemic attack (TIA), and cerebral infarction without residual deficits: Secondary | ICD-10-CM | POA: Diagnosis not present

## 2016-07-15 DIAGNOSIS — D473 Essential (hemorrhagic) thrombocythemia: Secondary | ICD-10-CM

## 2016-07-15 DIAGNOSIS — D649 Anemia, unspecified: Secondary | ICD-10-CM | POA: Diagnosis not present

## 2016-07-15 DIAGNOSIS — M797 Fibromyalgia: Secondary | ICD-10-CM | POA: Diagnosis not present

## 2016-07-15 DIAGNOSIS — D72820 Lymphocytosis (symptomatic): Secondary | ICD-10-CM

## 2016-07-15 DIAGNOSIS — Z803 Family history of malignant neoplasm of breast: Secondary | ICD-10-CM

## 2016-07-15 DIAGNOSIS — Z801 Family history of malignant neoplasm of trachea, bronchus and lung: Secondary | ICD-10-CM | POA: Diagnosis not present

## 2016-07-15 DIAGNOSIS — E785 Hyperlipidemia, unspecified: Secondary | ICD-10-CM

## 2016-07-15 DIAGNOSIS — Z87891 Personal history of nicotine dependence: Secondary | ICD-10-CM | POA: Diagnosis not present

## 2016-07-15 DIAGNOSIS — E119 Type 2 diabetes mellitus without complications: Secondary | ICD-10-CM | POA: Diagnosis not present

## 2016-07-15 DIAGNOSIS — E781 Pure hyperglyceridemia: Secondary | ICD-10-CM

## 2016-07-15 DIAGNOSIS — J45909 Unspecified asthma, uncomplicated: Secondary | ICD-10-CM

## 2016-07-15 DIAGNOSIS — I10 Essential (primary) hypertension: Secondary | ICD-10-CM

## 2016-07-15 DIAGNOSIS — G4730 Sleep apnea, unspecified: Secondary | ICD-10-CM | POA: Diagnosis not present

## 2016-07-15 LAB — CBC WITH DIFFERENTIAL/PLATELET
Basophils Absolute: 0 10*3/uL (ref 0–0.1)
Basophils Relative: 0 %
Eosinophils Absolute: 0.2 10*3/uL (ref 0–0.7)
Eosinophils Relative: 2 %
HCT: 38.9 % (ref 35.0–47.0)
Hemoglobin: 13.2 g/dL (ref 12.0–16.0)
Lymphocytes Relative: 35 %
Lymphs Abs: 3.6 10*3/uL (ref 1.0–3.6)
MCH: 28.2 pg (ref 26.0–34.0)
MCHC: 34 g/dL (ref 32.0–36.0)
MCV: 82.8 fL (ref 80.0–100.0)
Monocytes Absolute: 0.8 10*3/uL (ref 0.2–0.9)
Monocytes Relative: 7 %
Neutro Abs: 5.8 10*3/uL (ref 1.4–6.5)
Neutrophils Relative %: 56 %
Platelets: 256 10*3/uL (ref 150–440)
RBC: 4.7 MIL/uL (ref 3.80–5.20)
RDW: 14.6 % — ABNORMAL HIGH (ref 11.5–14.5)
WBC: 10.4 10*3/uL (ref 3.6–11.0)

## 2016-07-15 LAB — SEDIMENTATION RATE: Sed Rate: 33 mm/hr — ABNORMAL HIGH (ref 0–30)

## 2016-07-15 NOTE — Progress Notes (Signed)
Patient is here as a new patient  

## 2016-07-15 NOTE — Progress Notes (Signed)
[facility] day:  07/15/2016  Chief Complaint: Kristen Harris is a 55 y.o. female with lymphocytosis who is referred in consultation by Dr. Paulita Cradle for assessment and management.  HPI: The patient has had persistent leukocytosis. She has asthma.  She uses a nebulizer and inhaler.   She denies any  issues with infections.  She has not been put on steroids.  CBC on 04/16/2016 included a hematocrit 37.4, hemoglobin 12.7, MCV 82.5, platelets 226,000, white count 12,200 and ANC of 8300. Differential  included 69% segs, 22% lymphs, 7% monocytes and 2% eosinophils. Absolute lymphocyte count was 2600.  Labs on 06/11/2016 included a hematocrit 38.5, hemoglobin 12.9, platelets 287,000, white count 12,600 with an ANC of 7300. Absolute lymphocyte count was 4140. Monocyte count was 840.  Differential included 58% segs, 33% lymphs, 7% monocytes, and 2% eosinophils. Comprehensive metabolic panel was normal. Urinalysis was negative.  Symptomatically, she denies any B symptoms.  She denies any adenopathy, bruising or bleeding.   Past Medical History:  Diagnosis Date  . Anemia   . Asthma   . Diabetes mellitus without complication ([place])    type 2  . Eczema   . Fibromyalgia   . GERD (gastroesophageal reflux disease)   . History of bronchitis   . History of pneumonia   . Hyperlipidemia   . Hypertension   . Hypertriglyceridemia   . Shortness of breath dyspnea   . Sickle cell trait ([place])   . Sleep apnea   . Stroke ([place])   . Urinary frequency     Past Surgical History:  Procedure Laterality Date  . ABDOMINAL HYSTERECTOMY    . BREAST REDUCTION SURGERY Bilateral 09/21/2015  . BREAST REDUCTION SURGERY Bilateral 09/21/2015   Procedure: BILATERAL BREAST REDUCTION USING FREE NIPPLE GRAFT;  Surgeon: Crissie Reese, MD;  Location: [facility];  Service: Plastics;  Laterality: Bilateral;  . CESAREAN SECTION    . COLONOSCOPY WITH PROPOFOL N/A 04/21/2015   Procedure:  COLONOSCOPY WITH PROPOFOL;  Surgeon: Josefine Class, MD;  Location: [facility] ENDOSCOPY;  Service: Endoscopy;  Laterality: N/A;  . DIAGNOSTIC LAPAROSCOPY      Family History  Problem Relation Age of Onset  . Breast cancer Mother 69  . Diabetes Father   . Lung cancer Father     Social History:  reports that she quit smoking about 7 years ago. She has never used smokeless tobacco. She reports that she drinks alcohol. She reports that she does not use drugs.  She has not smoked in 6 years.  She previously smoked 1/2 pack per day since high school.  She drinks alcohol socially (wine cooler). The patient lives in [place].  She is a Secondary school teacher.  The patient is accompanied by her husband, Kristen Harris , today.  Allergies:  Allergies  Allergen Reactions  . Aspirin   . Erythromycin Hives  . Morphine And Related Nausea And Vomiting  . Orange Fruit [Citrus] Hives    "orange juice"  . Penicillins Hives  . Pork-Derived Metallurgist  . Tetracyclines & Related Hives  . Tomato     Current Medications: Current Outpatient Prescriptions  Medication Sig Dispense Refill  . acetaminophen (TYLENOL) 500 MG tablet Take 1,000 mg by mouth every 8 (eight) hours as needed for mild pain or moderate pain.    Marland Kitchen albuterol (PROVENTIL HFA;VENTOLIN HFA) 108 (90 BASE) MCG/ACT inhaler Inhale 2 puffs into the lungs every 6 (six) hours as needed for wheezing or shortness of breath.    Marland Kitchen  busPIRone (BUSPAR) 5 MG tablet Take 5 mg by mouth 3 (three) times daily.    . Cyanocobalamin (B-12 PO) Take 1 mL by mouth daily.     . DULoxetine (CYMBALTA) 60 MG capsule Take 60 mg by mouth daily.  0  . LEVEMIR FLEXTOUCH 100 UNIT/ML Pen Inject 42 Units into the skin at bedtime.   0  . Liraglutide (VICTOZA) 18 MG/3ML SOPN Inject into the skin.    Marland Kitchen lisinopril (PRINIVIL,ZESTRIL) 10 MG tablet Take 1 tablet by mouth daily.  0  . metFORMIN (GLUCOPHAGE-XR) 500 MG 24 hr tablet Take 1,000 mg by mouth daily with breakfast.    . omeprazole  (PRILOSEC) 20 MG capsule Take 1 capsule by mouth daily.  0  . triamcinolone cream (KENALOG) 0.1 % Apply 1 application topically 2 (two) times daily as needed. rash  0  . zolpidem (AMBIEN) 5 MG tablet Take 5 mg by mouth at bedtime as needed for sleep.    . Calcium Carb-Cholecalciferol 600-800 MG-UNIT CHEW Chew 1 tablet by mouth 2 (two) times daily.    . clindamycin (CLEOCIN) 300 MG capsule Take 1 capsule (300 mg total) by mouth 3 (three) times daily. (Patient not taking: Reported on 07/15/2016) 21 capsule 0  . docusate sodium (COLACE) 100 MG capsule Take 1 capsule (100 mg total) by mouth daily. (Patient not taking: Reported on 07/15/2016) 10 capsule 0  . methocarbamol (ROBAXIN) 500 MG tablet Take 1 tablet (500 mg total) by mouth every 6 (six) hours as needed for muscle spasms. (Patient not taking: Reported on 07/15/2016) 40 tablet 0  . ondansetron (ZOFRAN) 4 MG tablet Take 1 tablet (4 mg total) by mouth every 8 (eight) hours as needed for nausea or vomiting. (Patient not taking: Reported on 07/15/2016) 10 tablet 0   No current facility-administered medications for this visit.     Review of Systems:  GENERAL:  Feels good.  No fevers or sweats.  Weight gain. PERFORMANCE STATUS (ECOG):  1 HEENT:  No visual changes, runny nose, sore throat, mouth sores or tenderness. Lungs: No shortness of breath or cough.  No hemoptysis. Cardiac:  No chest pain, palpitations, orthopnea, or PND. GI:  Nausea.  No vomiting, diarrhea, constipation, melena or hematochezia. GU:  No urgency, frequency, dysuria, or hematuria. Musculoskeletal:  No back pain.  Left knee and left wrist issues.  No muscle tenderness. Extremities:  No pain or swelling. Skin:  No rashes or skin changes. Neuro:  Migraine headaches.  No numbness or weakness, balance or coordination issues. Endocrine: Diabetes.  No thyroid issues, hot flashes or night sweats. Psych:  No mood changes, depression or anxiety. Pain:  No focal pain. Review of systems:   All other systems reviewed and found to be negative.  Physical Exam: Blood pressure 119/81, pulse 80, temperature 97.4 F (36.3 C), temperature source Tympanic, resp. rate 18, height '5\' 6"'  (1.676 m), weight 212 lb 10.1 oz (96.5 kg). GENERAL:  Well developed, well nourished, sitting comfortably in the exam room in no acute distress. MENTAL STATUS:  Alert and oriented to person, place and time. HEAD:  Long dark hair.  Normocephalic, atraumatic, face symmetric, no Cushingoid features. EYES:  Brown eyes.  Pupils equal round and reactive to light and accomodation.  No conjunctivitis or scleral icterus. ENT:  Oropharynx clear without lesion.  Tongue normal. Mucous membranes moist.  RESPIRATORY:  Clear to auscultation without rales, wheezes or rhonchi. CARDIOVASCULAR:  Regular rate and rhythm without murmur, rub or gallop. ABDOMEN:  Wearing a girdle.  Soft, non-tender, with active bowel sounds, and no appreciable hepatosplenomegaly.  No masses. SKIN:  No rashes, ulcers or lesions. EXTREMITIES: No edema, no skin discoloration or tenderness.  No palpable cords. LYMPH NODES: No palpable cervical, supraclavicular, axillary or inguinal adenopathy  NEUROLOGICAL: Unremarkable. PSYCH:  Appropriate.   No visits with results within 3 Day(s) from this visit.  Latest known visit with results is:  Admission on 04/16/2016, Discharged on 04/16/2016  Component Date Value Ref Range Status  . WBC 04/16/2016 12.2* 3.6 - 11.0 K/uL Final  . RBC 04/16/2016 4.54  3.80 - 5.20 MIL/uL Final  . Hemoglobin 04/16/2016 12.7  12.0 - 16.0 g/dL Final  . HCT 04/16/2016 37.4  35.0 - 47.0 % Final  . MCV 04/16/2016 82.5  80.0 - 100.0 fL Final  . MCH 04/16/2016 28.0  26.0 - 34.0 pg Final  . MCHC 04/16/2016 34.0  32.0 - 36.0 g/dL Final  . RDW 04/16/2016 14.4  11.5 - 14.5 % Final  . Platelets 04/16/2016 226  150 - 440 K/uL Final  . Neutrophils Relative % 04/16/2016 69%  % Final  . Neutro Abs 04/16/2016 8.3* 1.4 - 6.5 K/uL Final   . Lymphocytes Relative 04/16/2016 22%  % Final  . Lymphs Abs 04/16/2016 2.6  1.0 - 3.6 K/uL Final  . Monocytes Relative 04/16/2016 7%  % Final  . Monocytes Absolute 04/16/2016 0.9  0.2 - 0.9 K/uL Final  . Eosinophils Relative 04/16/2016 2%  % Final  . Eosinophils Absolute 04/16/2016 0.3  0 - 0.7 K/uL Final  . Basophils Relative 04/16/2016 0%  % Final  . Basophils Absolute 04/16/2016 0.0  0 - 0.1 K/uL Final  . Sodium 04/16/2016 137  135 - 145 mmol/L Final  . Potassium 04/16/2016 3.9  3.5 - 5.1 mmol/L Final  . Chloride 04/16/2016 104  101 - 111 mmol/L Final  . CO2 04/16/2016 27  22 - 32 mmol/L Final  . Glucose, Bld 04/16/2016 115* 65 - 99 mg/dL Final  . BUN 04/16/2016 7  6 - 20 mg/dL Final  . Creatinine, Ser 04/16/2016 0.61  0.44 - 1.00 mg/dL Final  . Calcium 04/16/2016 9.0  8.9 - 10.3 mg/dL Final  . Total Protein 04/16/2016 7.7  6.5 - 8.1 g/dL Final  . Albumin 04/16/2016 3.9  3.5 - 5.0 g/dL Final  . AST 04/16/2016 27  15 - 41 U/L Final  . ALT 04/16/2016 30  14 - 54 U/L Final  . Alkaline Phosphatase 04/16/2016 76  38 - 126 U/L Final  . Total Bilirubin 04/16/2016 0.6  0.3 - 1.2 mg/dL Final  . GFR calc non Af Amer 04/16/2016 >60  >60 mL/min Final  . GFR calc Af Amer 04/16/2016 >60  >60 mL/min Final   Comment: (NOTE) The eGFR has been calculated using the CKD EPI equation. This calculation has not been validated in all clinical situations. eGFR's persistently <60 mL/min signify possible Chronic Kidney Disease.   . Anion gap 04/16/2016 6  5 - 15 Final  . Lipase 04/16/2016 27  11 - 51 U/L Final  . Specimen Description 04/19/2016 VAGINAL/RECTAL   Final  . Special Requests 04/19/2016 NONE   Final  . Culture 04/19/2016 NO BETA HEMOLYTIC STREPTOCOCCI ISOLATED   Final  . Report Status 04/19/2016 04/19/2016 FINAL   Final  . Streptococcus, Group A Screen (Dir* 04/16/2016 NEGATIVE  NEGATIVE Final    Assessment:  Kristen Harris is a 55 y.o. female with mild leukocytosis likely reactive in  nature.  WBC has been  in the 12,000 - 13,000 range.  She has had mild lymphocytosis (4330 - 4400).  Monocyte count is hovering around 800.  She denies any infections.  She has asthma.  Exam is unremarkable.  Plan: 1.  Review diagnosis of mild leukocytosis.  Suspect etiology is reactive.  Discuss work-up. 2.  Labs today:  CBC with diff, ESR, BCR-ABL, flow cytometry. 3.  RTC in 2 weeks for MD assessment and review of results  Lequita Asal, MD  07/15/2016, 12:02 PM

## 2016-07-18 LAB — COMP PANEL: LEUKEMIA/LYMPHOMA

## 2016-07-25 LAB — BCR-ABL1 FISH
Cells Analyzed: 200
Cells Counted: 200

## 2016-07-29 ENCOUNTER — Encounter: Payer: Self-pay | Admitting: Hematology and Oncology

## 2016-07-29 ENCOUNTER — Inpatient Hospital Stay: Payer: 59 | Attending: Hematology and Oncology | Admitting: Hematology and Oncology

## 2016-07-29 VITALS — BP 119/84 | HR 99 | Temp 94.8°F | Resp 18 | Wt 210.8 lb

## 2016-07-29 DIAGNOSIS — E119 Type 2 diabetes mellitus without complications: Secondary | ICD-10-CM | POA: Insufficient documentation

## 2016-07-29 DIAGNOSIS — D649 Anemia, unspecified: Secondary | ICD-10-CM | POA: Insufficient documentation

## 2016-07-29 DIAGNOSIS — M797 Fibromyalgia: Secondary | ICD-10-CM | POA: Insufficient documentation

## 2016-07-29 DIAGNOSIS — J45909 Unspecified asthma, uncomplicated: Secondary | ICD-10-CM | POA: Insufficient documentation

## 2016-07-29 DIAGNOSIS — E781 Pure hyperglyceridemia: Secondary | ICD-10-CM | POA: Diagnosis not present

## 2016-07-29 DIAGNOSIS — Z8701 Personal history of pneumonia (recurrent): Secondary | ICD-10-CM

## 2016-07-29 DIAGNOSIS — Z79899 Other long term (current) drug therapy: Secondary | ICD-10-CM | POA: Insufficient documentation

## 2016-07-29 DIAGNOSIS — R350 Frequency of micturition: Secondary | ICD-10-CM

## 2016-07-29 DIAGNOSIS — Z8673 Personal history of transient ischemic attack (TIA), and cerebral infarction without residual deficits: Secondary | ICD-10-CM | POA: Diagnosis not present

## 2016-07-29 DIAGNOSIS — E785 Hyperlipidemia, unspecified: Secondary | ICD-10-CM | POA: Insufficient documentation

## 2016-07-29 DIAGNOSIS — Z87891 Personal history of nicotine dependence: Secondary | ICD-10-CM | POA: Insufficient documentation

## 2016-07-29 DIAGNOSIS — Z7984 Long term (current) use of oral hypoglycemic drugs: Secondary | ICD-10-CM | POA: Diagnosis not present

## 2016-07-29 DIAGNOSIS — D573 Sickle-cell trait: Secondary | ICD-10-CM | POA: Diagnosis not present

## 2016-07-29 DIAGNOSIS — D72829 Elevated white blood cell count, unspecified: Secondary | ICD-10-CM | POA: Diagnosis not present

## 2016-07-29 DIAGNOSIS — I10 Essential (primary) hypertension: Secondary | ICD-10-CM | POA: Insufficient documentation

## 2016-07-29 DIAGNOSIS — K219 Gastro-esophageal reflux disease without esophagitis: Secondary | ICD-10-CM

## 2016-07-29 DIAGNOSIS — Z794 Long term (current) use of insulin: Secondary | ICD-10-CM

## 2016-07-29 DIAGNOSIS — G4730 Sleep apnea, unspecified: Secondary | ICD-10-CM | POA: Insufficient documentation

## 2016-07-29 NOTE — Progress Notes (Signed)
[facility] day:  07/29/2016  Chief Complaint: Kristen Harris is a 55 y.o. female with lymphocytosis who is seen for review of work-up and discussion regarding direction of therapy.  HPI: The patient was last seen in the hematology clinic on 07/15/2016.  She was noted to have persistent leukocytosis felt reactive in nature.  She denied any infections.  She denied any B symptoms.  Work-up revealed a hematocrit of 38.9, hemoglobin 13.2, MCV 82.8, platelets 256,000, WBC 10,400 with a normal differential.  Monocyte count was 800.  ESR was 33 (0-30).  BCR-ABL by FISH was negative.  Flow cytometry revealed no monoclonal B cell population.  Kappa:lambda ratio was 1.4.  There was no loss of, or aberrant expression of the pan T cell antigens to suggest a neoplastic T cell process.  CD4:CD8 ratio was 2.0   There were no circulating blasts.  There was no immunophenotypic evidence of abnormal myeloid maturation.  Symptomatically, she denies any complaint.  Past Medical History:  Diagnosis Date  . Anemia   . Asthma   . Diabetes mellitus without complication ([place])    type 2  . Eczema   . Fibromyalgia   . GERD (gastroesophageal reflux disease)   . History of bronchitis   . History of pneumonia   . Hyperlipidemia   . Hypertension   . Hypertriglyceridemia   . Shortness of breath dyspnea   . Sickle cell trait ([place])   . Sleep apnea   . Stroke ([place])   . Urinary frequency     Past Surgical History:  Procedure Laterality Date  . ABDOMINAL HYSTERECTOMY    . BREAST REDUCTION SURGERY Bilateral 09/21/2015  . BREAST REDUCTION SURGERY Bilateral 09/21/2015   Procedure: BILATERAL BREAST REDUCTION USING FREE NIPPLE GRAFT;  Surgeon: Crissie Reese, MD;  Location: [place];  Service: Plastics;  Laterality: Bilateral;  . CESAREAN SECTION    . COLONOSCOPY WITH PROPOFOL N/A 04/21/2015   Procedure: COLONOSCOPY WITH PROPOFOL;  Surgeon: Josefine Class, MD;  Location: [facility]  ENDOSCOPY;  Service: Endoscopy;  Laterality: N/A;  . DIAGNOSTIC LAPAROSCOPY      Family History  Problem Relation Age of Onset  . Breast cancer Mother 48  . Diabetes Father   . Lung cancer Father     Social History:  reports that she quit smoking about 7 years ago. She has never used smokeless tobacco. She reports that she drinks alcohol. She reports that she does not use drugs.  She has not smoked in 6 years.  She previously smoked 1/2 pack per day since high school.  She drinks alcohol socially (wine cooler). The patient lives in [place].  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  Allergies:  Allergies  Allergen Reactions  . Aspirin   . Erythromycin Hives  . Morphine And Related Nausea And Vomiting  . Orange Fruit [Citrus] Hives    "orange juice"  . Penicillins Hives  . Pork-Derived Metallurgist  . Tetracyclines & Related Hives  . Tomato     Current Medications: Current Outpatient Prescriptions  Medication Sig Dispense Refill  . acetaminophen (TYLENOL) 500 MG tablet Take 1,000 mg by mouth every 8 (eight) hours as needed for mild pain or moderate pain.    Marland Kitchen albuterol (PROVENTIL HFA;VENTOLIN HFA) 108 (90 BASE) MCG/ACT inhaler Inhale 2 puffs into the lungs every 6 (six) hours as needed for wheezing or shortness of breath.    . busPIRone (BUSPAR) 5 MG tablet  Take 5 mg by mouth 3 (three) times daily.    . Calcium Carb-Cholecalciferol 600-800 MG-UNIT CHEW Chew 1 tablet by mouth 2 (two) times daily.    . Cyanocobalamin (B-12 PO) Take 1 mL by mouth daily.     Marland Kitchen docusate sodium (COLACE) 100 MG capsule Take 1 capsule (100 mg total) by mouth daily. 10 capsule 0  . DULoxetine (CYMBALTA) 60 MG capsule Take 60 mg by mouth daily.  0  . LEVEMIR FLEXTOUCH 100 UNIT/ML Pen Inject 42 Units into the skin at bedtime.   0  . Liraglutide (VICTOZA) 18 MG/3ML SOPN Inject into the skin.    Marland Kitchen lisinopril (PRINIVIL,ZESTRIL) 10 MG tablet Take 1 tablet by mouth daily.  0  . metFORMIN  (GLUCOPHAGE-XR) 500 MG 24 hr tablet Take 1,000 mg by mouth daily with breakfast.    . methocarbamol (ROBAXIN) 500 MG tablet Take 1 tablet (500 mg total) by mouth every 6 (six) hours as needed for muscle spasms. 40 tablet 0  . omeprazole (PRILOSEC) 20 MG capsule Take 1 capsule by mouth daily.  0  . ondansetron (ZOFRAN) 4 MG tablet Take 1 tablet (4 mg total) by mouth every 8 (eight) hours as needed for nausea or vomiting. 10 tablet 0  . triamcinolone cream (KENALOG) 0.1 % Apply 1 application topically 2 (two) times daily as needed. rash  0  . zolpidem (AMBIEN) 5 MG tablet Take 5 mg by mouth at bedtime as needed for sleep.     No current facility-administered medications for this visit.     Review of Systems:  GENERAL:  Feels good.  No fevers or sweats.  Weight gain. PERFORMANCE STATUS (ECOG):  1 HEENT:  No visual changes, runny nose, sore throat, mouth sores or tenderness. Lungs: No shortness of breath or cough.  No hemoptysis.  CPAP. Cardiac:  No chest pain, palpitations, orthopnea, or PND. GI:  No appetite.  No nausea, vomiting, diarrhea, constipation, melena or hematochezia. GU:  No urgency, frequency, dysuria, or hematuria. Musculoskeletal:  No back pain.  Left knee and left wrist issues.  No muscle tenderness. Extremities:  No pain or swelling. Skin:  No rashes or skin changes. Neuro:  Migraine headaches.  No numbness or weakness, balance or coordination issues. Endocrine: Diabetes.  No thyroid issues, hot flashes or night sweats. Psych:  Trouble sleeping.  No mood changes, depression or anxiety. Pain:  No focal pain. Review of systems:  All other systems reviewed and found to be negative.  Physical Exam: Blood pressure 119/84, pulse 99, temperature (!) 94.8 F (34.9 C), temperature source Tympanic, resp. rate 18, weight 210 lb 12.2 oz (95.6 kg). GENERAL:  Well developed, well nourished, woman sitting comfortably in the exam room in no acute distress. MENTAL STATUS:  Alert and  oriented to person, place and time. HEAD:  Long dark hair.  Normocephalic, atraumatic, face symmetric, no Cushingoid features. EYES:  Brown eyes.  No conjunctivitis or scleral icterus. NEUROLOGICAL: Unremarkable. PSYCH:  Appropriate.   No visits with results within 3 Day(s) from this visit.  Latest known visit with results is:  Appointment on 07/15/2016  Component Date Value Ref Range Status  . WBC 07/15/2016 10.4  3.6 - 11.0 K/uL Final  . RBC 07/15/2016 4.70  3.80 - 5.20 MIL/uL Final  . Hemoglobin 07/15/2016 13.2  12.0 - 16.0 g/dL Final  . HCT 07/15/2016 38.9  35.0 - 47.0 % Final  . MCV 07/15/2016 82.8  80.0 - 100.0 fL Final  . [facility] 07/15/2016 28.2  26.0 - 34.0 pg Final  . MCHC 07/15/2016 34.0  32.0 - 36.0 g/dL Final  . RDW 07/15/2016 14.6* 11.5 - 14.5 % Final  . Platelets 07/15/2016 256  150 - 440 K/uL Final  . Neutrophils Relative % 07/15/2016 56  % Final  . Neutro Abs 07/15/2016 5.8  1.4 - 6.5 K/uL Final  . Lymphocytes Relative 07/15/2016 35  % Final  . Lymphs Abs 07/15/2016 3.6  1.0 - 3.6 K/uL Final  . Monocytes Relative 07/15/2016 7  % Final  . Monocytes Absolute 07/15/2016 0.8  0.2 - 0.9 K/uL Final  . Eosinophils Relative 07/15/2016 2  % Final  . Eosinophils Absolute 07/15/2016 0.2  0 - 0.7 K/uL Final  . Basophils Relative 07/15/2016 0  % Final  . Basophils Absolute 07/15/2016 0.0  0 - 0.1 K/uL Final  . Sed Rate 07/15/2016 33* 0 - 30 mm/hr Final  . Specimen Type 07/25/2016 BLOOD   Final  . Cells Counted 07/25/2016 200   Final  . Cells Analyzed 07/25/2016 200   Final  . FISH Result 07/25/2016 Comment:   Final  . Interpretation 07/25/2016 Comment:   Final   Comment: (NOTE) .nuc ish 9q34(ASS1,ABL1)x2,22q11.2(BCRx2)[200].    The fluorescence in situ hybridization (FISH) study was negative. FISH, using unique sequence DNA probes for the ABL1 and BCR gene regions showed two ABL1 signals (red), two control ASS1  gene signals (aqua) located adjacent to the ABL1 locus at 9q34,  and two BCR signals (green) at 22q11.2 in all interphase nuclei examined. There was NO evidence of CML or ALL-associated BCR/ABL1 dual fusion signals in this analysis.    This analysis is limited to abnormalities detectable by the specific probes included in the study. FISH results should be interpreted within the context of a full cytogenetic analysis and hematologic evaluation.    This test was developed and its performance characteristics determined by [facility]). It has not been cleared or approved by the U.S. Food and Drug Administration. The DNA probe vendor for this study was [employer] (physical sciences)).   . Director Review: 07/25/2016 Comment:   Final   Comment: (NOTE) Nikki Dom. Leretha Dykes, PhD, [facility] Performed At: [facility] [address] [place], [place] [id number] Nechama Guard MD NO:[id number]   . PATH INTERP XXX-IMP 07/18/2016 Comment   Final  . CLINICAL INFO 07/18/2016 Comment   Corrected   Comment: (NOTE) Anemia Accompanying CBC dated 07/15/16 shows:  WBC 10.4, Neu 5.8, Lym 3.6, Mon 0.8.   Marland Kitchen Misc Source 07/18/2016 Comment   Final  . ASSESSMENT OF LEUKOCYTES 07/18/2016 Comment   Final   Comment: (NOTE) No monoclonal B cell population is detected. kappa:lambda ratio 1.4 There is no loss of, or aberrant expression of, the pan T cell antigens to suggest a neoplastic T cell process. CD4:CD8 ratio 2.0 No circulating blasts are detected. There is no immunophenotypic  evidence of abnormal myeloid maturation. Analysis of the leukocyte population shows: granulocytes 61%, monocytes 4%, lymphocytes 35%, blasts <0.1%, B cells 5%, T cells 27%, NK cells 3%.   . % Viable Cells 07/18/2016 Comment   Corrected  . ANALYSIS AND GATING STRATEGY 07/18/2016 Comment   Final  . IMMUNOPHENOTYPING STUDY 07/18/2016 Comment   Final   Comment: (NOTE) CD2       Normal         CD3       Normal CD4       Normal  CD5        Normal CD7       Normal         CD8       Normal CD10      Normal         CD11b     Normal CD13      Normal         CD14      Normal CD16      Normal         CD19      Normal CD20      Normal         CD33      Normal CD34      Normal         CD38      Normal CD45      Normal         CD56      Normal CD57      Normal         CD117     Normal HLA-DR    Normal         KAPPA     Normal LAMBDA    Normal         CD64      Normal   . PATHOLOGIST NAME 07/18/2016 Comment   Final  . COMMENT: 07/18/2016 Comment   Corrected   Comment: (NOTE) Each antibody in this assay was utilized to assess for potential abnormalities of studied cell populations or to characterize identified abnormalities. This test was developed and its performance characteristics determined by [facility].  It has not been cleared or approved by the U.S. Food and Drug Administration. The FDA has determined that such clearance or approval is not necessary. This test is used for clinical purposes.  It should not be regarded as investigational or for research. Performed At: -[facility] [address] [place], [place] [id number] Nechama Guard MD XT:[id number] Performed At: [facility] [address] [place], [place] [id number] Nechama Guard MD GH:[id number]     Assessment:  Kristen Harris is a 55 y.o. female with mild reactive leukocytosis.  WBC has been in the 12,000 - 13,000 range.  She has had mild lymphocytosis (4330 - 4400).  Monocyte count is hovering around 800.  Work-up on 07/15/2016 revealed a hematocrit of 38.9, hemoglobin 13.2, MCV 82.8, platelets 256,000, WBC 10,400 with a normal differential.  BCR-ABL by FISH and flow cytometry were negative.  ESR was 33 (0-30).  She denies any infections.  She has asthma.  Exam is unremarkable.  Plan: 1.  Review work-up.  No evidence of leukemia.  Suspect reactive leukocytosis.  WBC now normal. 2.  RTC prn.   Lequita Asal, MD  07/29/2016, 2:51 PM

## 2016-07-29 NOTE — Progress Notes (Signed)
Patient here today for lab results.  States she has not had much of an appetite lately.  Also states she is not sleeping very well.  Takes ambien to help and also uses CPAP.

## 2016-07-30 ENCOUNTER — Other Ambulatory Visit: Payer: Self-pay | Admitting: *Deleted

## 2016-08-09 ENCOUNTER — Ambulatory Visit
Admission: RE | Admit: 2016-08-09 | Discharge: 2016-08-09 | Disposition: A | Discharge status: Home / Self Care | Payer: 59 | Source: Ambulatory Visit | Attending: Physician Assistant | Admitting: Physician Assistant

## 2016-08-09 DIAGNOSIS — Z1239 Encounter for other screening for malignant neoplasm of breast: Secondary | ICD-10-CM

## 2017-05-29 ENCOUNTER — Emergency Department
Admission: EM | Admit: 2017-05-29 | Discharge: 2017-05-29 | Disposition: A | Discharge status: Home / Self Care | Payer: BLUE CROSS/BLUE SHIELD | Attending: Emergency Medicine | Admitting: Emergency Medicine

## 2017-05-29 ENCOUNTER — Encounter: Payer: Self-pay | Admitting: Emergency Medicine

## 2017-05-29 DIAGNOSIS — J45909 Unspecified asthma, uncomplicated: Secondary | ICD-10-CM | POA: Insufficient documentation

## 2017-05-29 DIAGNOSIS — I10 Essential (primary) hypertension: Secondary | ICD-10-CM | POA: Insufficient documentation

## 2017-05-29 DIAGNOSIS — R739 Hyperglycemia, unspecified: Secondary | ICD-10-CM | POA: Diagnosis present

## 2017-05-29 DIAGNOSIS — Z87891 Personal history of nicotine dependence: Secondary | ICD-10-CM | POA: Insufficient documentation

## 2017-05-29 DIAGNOSIS — Z7984 Long term (current) use of oral hypoglycemic drugs: Secondary | ICD-10-CM | POA: Diagnosis not present

## 2017-05-29 DIAGNOSIS — E1165 Type 2 diabetes mellitus with hyperglycemia: Secondary | ICD-10-CM | POA: Diagnosis not present

## 2017-05-29 DIAGNOSIS — Z79899 Other long term (current) drug therapy: Secondary | ICD-10-CM | POA: Diagnosis not present

## 2017-05-29 DIAGNOSIS — Z794 Long term (current) use of insulin: Secondary | ICD-10-CM | POA: Diagnosis not present

## 2017-05-29 LAB — COMPREHENSIVE METABOLIC PANEL
ALBUMIN: 3.8 g/dL (ref 3.5–5.0)
ALK PHOS: 118 U/L (ref 38–126)
ALT: 24 U/L (ref 14–54)
AST: 22 U/L (ref 15–41)
Anion gap: 9 (ref 5–15)
BILIRUBIN TOTAL: 0.7 mg/dL (ref 0.3–1.2)
BUN: 15 mg/dL (ref 6–20)
CO2: 25 mmol/L (ref 22–32)
Calcium: 8.7 mg/dL — ABNORMAL LOW (ref 8.9–10.3)
Chloride: 101 mmol/L (ref 101–111)
Creatinine, Ser: 0.61 mg/dL (ref 0.44–1.00)
GFR calc Af Amer: >60 mL/min (ref >60)
GFR calc non Af Amer: >60 mL/min (ref >60)
GLUCOSE: 294 mg/dL — AB (ref 65–99)
POTASSIUM: 3.8 mmol/L (ref 3.5–5.1)
Sodium: 135 mmol/L (ref 135–145)
TOTAL PROTEIN: 7.5 g/dL (ref 6.5–8.1)

## 2017-05-29 LAB — URINALYSIS, COMPLETE (UACMP) WITH MICROSCOPIC
Bilirubin Urine: NEGATIVE
Ketones, ur: NEGATIVE mg/dL
Leukocytes, UA: NEGATIVE
Nitrite: NEGATIVE
PH: 5 (ref 5.0–8.0)
Protein, ur: NEGATIVE mg/dL
SPECIFIC GRAVITY, URINE: 1.018 (ref 1.005–1.030)

## 2017-05-29 LAB — CBC
HEMATOCRIT: 36.8 % (ref 35.0–47.0)
HEMOGLOBIN: 12.8 g/dL (ref 12.0–16.0)
MCH: 28.9 pg (ref 26.0–34.0)
MCHC: 34.8 g/dL (ref 32.0–36.0)
MCV: 83 fL (ref 80.0–100.0)
Platelets: 184 10*3/uL (ref 150–440)
RBC: 4.43 MIL/uL (ref 3.80–5.20)
RDW: 13.4 % (ref 11.5–14.5)
WBC: 8.6 10*3/uL (ref 3.6–11.0)

## 2017-05-29 LAB — GLUCOSE, CAPILLARY
GLUCOSE-CAPILLARY: 378 mg/dL — AB (ref 65–99)
Glucose-Capillary: 451 mg/dL — ABNORMAL HIGH (ref 65–99)
Glucose-Capillary: 310 mg/dL — ABNORMAL HIGH (ref 65–99)
Glucose-Capillary: 282 mg/dL — ABNORMAL HIGH (ref 65–99)

## 2017-05-29 MED ORDER — ACETAMINOPHEN 500 MG PO TABS
1000.0000 mg | ORAL_TABLET | Freq: Once | ORAL | Status: AC
  Administered 2017-05-29: 1000 mg via ORAL
  Filled 2017-05-29: qty 2

## 2017-05-29 MED ORDER — INSULIN ASPART 100 UNIT/ML ~~LOC~~ SOLN
10.0000 [IU] | Freq: Once | SUBCUTANEOUS | Status: AC
  Administered 2017-05-29: 10 [IU] via INTRAVENOUS
  Filled 2017-05-29: qty 1

## 2017-05-29 MED ORDER — HYDROCODONE-ACETAMINOPHEN 5-325 MG PO TABS: 1.0000 | ORAL_TABLET | ORAL | 0 refills | Status: AC | PRN

## 2017-05-29 MED ORDER — MORPHINE SULFATE (PF) 4 MG/ML IV SOLN
INTRAVENOUS | Status: AC
  Filled 2017-05-29: qty 1

## 2017-05-29 MED ORDER — SODIUM CHLORIDE 0.9 % IV BOLUS (SEPSIS)
1000.0000 mL | Freq: Once | INTRAVENOUS | Status: AC
  Administered 2017-05-29: 1000 mL via INTRAVENOUS

## 2017-05-29 MED ORDER — MORPHINE SULFATE (PF) 4 MG/ML IV SOLN
4.0000 mg | Freq: Once | INTRAVENOUS | Status: AC
  Administered 2017-05-29: 4 mg via INTRAVENOUS

## 2017-05-29 NOTE — ED Notes (Signed)
NAD noted at this time. Pt taken to lobby via wheelchair at this time by her family. Pt denies any comments/concerns regarding D/C at this time.

## 2017-05-29 NOTE — ED Triage Notes (Signed)
Pt reports elevated blood sugar at home. Pt reports she sometimes forgets to take her insulin. Pt reports numbness and tingling to left hand fingers and headache.

## 2017-05-29 NOTE — ED Notes (Signed)
This RN to bedside. Pt is noted to be more alert and less sleepy. Repeat CBG 282. O2 removed for trial off of oxygen. Pt states understanding at this time. Requesting something to drink. Explained would have to wait for MD approval. Pt states understanding.

## 2017-05-29 NOTE — ED Notes (Signed)
Pt visualized in NAD, continues to rest in bed with lights dimmed and TV on. Pt remains on 2L via [place]. Will continue to monitor for further patient needs.

## 2017-05-29 NOTE — ED Provider Notes (Addendum)
[facility] Emergency Department Provider Note  Time seen: 8:18 AM  I have reviewed the triage vital signs and the nursing notes.   HISTORY  Chief Complaint Hyperglycemia    HPI Kristen Harris is a 56 y.o. female a past medical history of asthma, anemia, gastric reflux, hypertension, hyperlipidemia, diabetes on insulin who presents to the emergency department for hyperglycemia. According to the patient for the past 2 days she has been urinating extremely frequently and has been very thirsty. Patient states her blood sugars have read "high" for the past 48 hours. She states typically her blood glucose is very well controlled. Patient states mild congestion, denies any cough. Denies any chest pain, abdominal pain, fever, nausea, vomiting, or dysuria. Does state urinary frequency as well as intermittent loose stools.  Past Medical History:  Diagnosis Date  . Anemia   . Asthma   . Diabetes mellitus without complication ([place])    type 2  . Eczema   . Fibromyalgia   . GERD (gastroesophageal reflux disease)   . History of bronchitis   . History of pneumonia   . Hyperlipidemia   . Hypertension   . Hypertriglyceridemia   . Shortness of breath dyspnea   . Sickle cell trait ([place])   . Sleep apnea   . Stroke ([place])   . Urinary frequency     Patient Active Problem List   Diagnosis Date Noted  . Leukocytosis 07/15/2016  . Lymphocytosis 07/15/2016  . Breast hypertrophy 09/21/2015    Past Surgical History:  Procedure Laterality Date  . ABDOMINAL HYSTERECTOMY    . BREAST REDUCTION SURGERY Bilateral 09/21/2015  . BREAST REDUCTION SURGERY Bilateral 09/21/2015   Procedure: BILATERAL BREAST REDUCTION USING FREE NIPPLE GRAFT;  Surgeon: Crissie Reese, MD;  Location: [facility];  Service: Plastics;  Laterality: Bilateral;  . CESAREAN SECTION    . COLONOSCOPY WITH PROPOFOL N/A 04/21/2015   Procedure: COLONOSCOPY WITH PROPOFOL;  Surgeon: Josefine Class, MD;  Location: [facility]  ENDOSCOPY;  Service: Endoscopy;  Laterality: N/A;  . DIAGNOSTIC LAPAROSCOPY    . REDUCTION MAMMAPLASTY Bilateral 2016    Prior to Admission medications   Medication Sig Start Date End Date Taking? Authorizing Provider  acetaminophen (TYLENOL) 500 MG tablet Take 1,000 mg by mouth every 8 (eight) hours as needed for mild pain or moderate pain.    [provider]  albuterol (PROVENTIL HFA;VENTOLIN HFA) 108 (90 BASE) MCG/ACT inhaler Inhale 2 puffs into the lungs every 6 (six) hours as needed for wheezing or shortness of breath.    [provider]  busPIRone (BUSPAR) 5 MG tablet Take 5 mg by mouth 3 (three) times daily.    [provider]  Calcium Carb-Cholecalciferol 600-800 MG-UNIT CHEW Chew 1 tablet by mouth 2 (two) times daily.    [provider]  Cyanocobalamin (B-12 PO) Take 1 mL by mouth daily.     [provider]  docusate sodium (COLACE) 100 MG capsule Take 1 capsule (100 mg total) by mouth daily. 09/22/15   Crissie Reese, MD  DULoxetine (CYMBALTA) 60 MG capsule Take 60 mg by mouth daily. 03/22/15   [provider]  LEVEMIR FLEXTOUCH 100 UNIT/ML Pen Inject 42 Units into the skin at bedtime.  03/17/15   [provider]  Liraglutide (VICTOZA) 18 MG/3ML SOPN Inject into the skin.    [provider]  lisinopril (PRINIVIL,ZESTRIL) 10 MG tablet Take 1 tablet by mouth daily. 03/17/15   [provider]  metFORMIN (GLUCOPHAGE-XR) 500 MG 24  hr tablet Take 1,000 mg by mouth daily with breakfast.    [provider]  methocarbamol (ROBAXIN) 500 MG tablet Take 1 tablet (500 mg total) by mouth every 6 (six) hours as needed for muscle spasms. 09/22/15   Crissie Reese, MD  omeprazole (PRILOSEC) 20 MG capsule Take 1 capsule by mouth daily. 04/04/15   [provider]  triamcinolone cream (KENALOG) 0.1 % Apply 1 application topically 2 (two) times daily as needed. rash 01/12/15   [provider]  zolpidem  (AMBIEN) 5 MG tablet Take 5 mg by mouth at bedtime as needed for sleep.    [provider]    Allergies  Allergen Reactions  . Aspirin   . Erythromycin Hives  . Morphine And Related Nausea And Vomiting  . Orange Fruit [Citrus] Hives    "orange juice"  . Penicillins Hives  . Pork-Derived Metallurgist  . Tetracyclines & Related Hives  . Tomato     Family History  Problem Relation Age of Onset  . Breast cancer Mother 84  . Diabetes Father   . Lung cancer Father     Social History Social History  Substance Use Topics  . Smoking status: Former Smoker    Quit date: 07/17/2009  . Smokeless tobacco: Never Used  . Alcohol use Yes     Comment: socially    Review of Systems Constitutional: Negative for fever. ENT: Mild congestion Cardiovascular: Negative for chest pain. Respiratory: Negative for shortness of breath. Gastrointestinal: Negative for abdominal pain, vomiting. Mild diarrhea. Genitourinary: Negative for dysuria. Positive for urinary frequency. Musculoskeletal: Negative for back pain. Skin: Negative for rash. Neurological: Moderate headache. All other ROS negative  ____________________________________________   PHYSICAL EXAM:  VITAL SIGNS: ED Triage Vitals  Enc Vitals Group     BP 05/29/17 0755 137/81     Pulse Rate 05/29/17 0755 88     Resp 05/29/17 0755 16     Temp 05/29/17 0755 98.2 F (36.8 C)     Temp Source 05/29/17 0755 Oral     SpO2 05/29/17 0755 96 %     Weight 05/29/17 0755 200 lb (90.7 kg)     Height 05/29/17 0755 5\' 6"  (1.676 m)     Head Circumference --      Peak Flow --      Pain Score 05/29/17 0759 10     Pain Loc --      Pain Edu? --      Excl. in [place]? --    Constitutional: Alert and oriented. Well appearing and in no distress. Eyes: Normal exam ENT   Head: Normocephalic and atraumatic   Mouth/Throat: Mucous membranes are moist. Cardiovascular: Normal rate, regular rhythm. No murmur Respiratory: Normal  respiratory effort without tachypnea nor retractions. Breath sounds are clear  Gastrointestinal: Soft and nontender. No distention.   Musculoskeletal: Nontender with normal range of motion in all extremities. Neurologic:  Normal speech and language. No gross focal neurologic deficits Skin:  Skin is warm, dry and intact.  Psychiatric: Mood and affect are normal.  ____________________________________________   INITIAL IMPRESSION / ASSESSMENT AND PLAN / ED COURSE  Pertinent labs & imaging results that were available during my care of the patient were reviewed by me and considered in my medical decision making (see chart for details).  The patient presents to the emergency department for hyperglycemia for the past 48 hours. Patient's fingerstick is 451 in the emergency department. No obvious infectious symptoms on review of systems. We will check  labs, urinalysis. We will treat with insulin and IV hydrate. Patient also states a moderate headache which is likely related to the hyperglycemia/dehydration we will dose Tylenol and continue to closely monitor.  Patient's labs are largely within normal limits. Repeat blood glucose is less than 300. Patient continues states some intermittent leg cramps. We'll discharge with a very short course of pain medication. I discussed dietary control with continued oral hydration and PCP follow-up. The patient is agreeable to this plan.  EKG reviewed and interpreted by myself shows normal sinus rhythm at 77 bpm, narrow QRS, normal axis, normal intervals, no ST changes. Overall normal EKG. ____________________________________________   FINAL CLINICAL IMPRESSION(S) / ED DIAGNOSES  Hyperglycemia    Harvest Dark, MD 05/29/17 1324    Harvest Dark, MD 05/29/17 [phone]

## 2017-05-29 NOTE — ED Notes (Signed)
Pt given warm blanket at this time 

## 2017-05-29 NOTE — ED Notes (Signed)
MD notified that this RN called lab, per lab no specimens with patient labels where found. This RN will recollect blood specimens and resend to lab.

## 2017-05-29 NOTE — ED Notes (Signed)
This RN to bedside at this time, introduced self to patient. Pt is noted to be resting in bed with 2L O2 via [place] on. Pt awakens with mild verbal stimuli at this time. Pt responds appropriately to questions asked, appears comfortable and calm in the bed at this time. Blood recollected by this RN. Pt requests warm blanket, CBG rechecked, 310 at this time. Pt continues to received 2L bolus via 22g to L AC. Will continue to monitor for further patient needs. VSS and WNL.

## 2017-07-24 ENCOUNTER — Other Ambulatory Visit: Payer: Self-pay | Admitting: Physician Assistant

## 2017-07-24 DIAGNOSIS — Z1239 Encounter for other screening for malignant neoplasm of breast: Secondary | ICD-10-CM

## 2017-08-19 ENCOUNTER — Ambulatory Visit
Admission: RE | Admit: 2017-08-19 | Discharge: 2017-08-19 | Disposition: A | Discharge status: Home / Self Care | Payer: BLUE CROSS/BLUE SHIELD | Source: Ambulatory Visit | Attending: Physician Assistant | Admitting: Physician Assistant

## 2017-08-19 DIAGNOSIS — Z1239 Encounter for other screening for malignant neoplasm of breast: Secondary | ICD-10-CM

## 2017-08-19 DIAGNOSIS — Z1231 Encounter for screening mammogram for malignant neoplasm of breast: Secondary | ICD-10-CM | POA: Insufficient documentation

## 2017-09-15 ENCOUNTER — Ambulatory Visit
Admission: RE | Admit: 2017-09-15 | Discharge: 2017-09-15 | Disposition: A | Discharge status: Home / Self Care | Payer: BLUE CROSS/BLUE SHIELD | Source: Ambulatory Visit | Attending: Otolaryngology | Admitting: Otolaryngology

## 2017-09-15 ENCOUNTER — Other Ambulatory Visit: Payer: Self-pay | Admitting: Otolaryngology

## 2017-09-15 DIAGNOSIS — R05 Cough: Secondary | ICD-10-CM | POA: Insufficient documentation

## 2017-09-15 DIAGNOSIS — R053 Chronic cough: Secondary | ICD-10-CM

## 2017-09-15 DIAGNOSIS — E041 Nontoxic single thyroid nodule: Secondary | ICD-10-CM

## 2017-09-19 ENCOUNTER — Ambulatory Visit: Payer: BLUE CROSS/BLUE SHIELD

## 2017-09-23 ENCOUNTER — Ambulatory Visit: Admission: RE | Admit: 2017-09-23 | Payer: BLUE CROSS/BLUE SHIELD | Source: Ambulatory Visit

## 2017-10-01 ENCOUNTER — Ambulatory Visit
Admission: RE | Admit: 2017-10-01 | Discharge: 2017-10-01 | Disposition: A | Discharge status: Home / Self Care | Payer: BLUE CROSS/BLUE SHIELD | Source: Ambulatory Visit | Attending: Otolaryngology | Admitting: Otolaryngology

## 2017-10-01 DIAGNOSIS — R519 Headache, unspecified: Secondary | ICD-10-CM | POA: Insufficient documentation

## 2017-10-01 DIAGNOSIS — E042 Nontoxic multinodular goiter: Secondary | ICD-10-CM | POA: Insufficient documentation

## 2017-10-01 DIAGNOSIS — R51 Headache: Secondary | ICD-10-CM

## 2017-10-01 DIAGNOSIS — E041 Nontoxic single thyroid nodule: Secondary | ICD-10-CM

## 2017-10-01 DIAGNOSIS — R200 Anesthesia of skin: Secondary | ICD-10-CM | POA: Insufficient documentation

## 2017-10-01 DIAGNOSIS — R202 Paresthesia of skin: Secondary | ICD-10-CM

## 2017-10-01 DIAGNOSIS — G43719 Chronic migraine without aura, intractable, without status migrainosus: Secondary | ICD-10-CM | POA: Insufficient documentation

## 2017-10-23 ENCOUNTER — Encounter: Payer: Self-pay | Admitting: Internal Medicine

## 2017-10-23 ENCOUNTER — Ambulatory Visit: Payer: BLUE CROSS/BLUE SHIELD | Admitting: Internal Medicine

## 2017-10-23 VITALS — BP 142/90 | HR 90 | Resp 16 | Ht 66.0 in | Wt 214.0 lb

## 2017-10-23 DIAGNOSIS — J4520 Mild intermittent asthma, uncomplicated: Secondary | ICD-10-CM | POA: Diagnosis not present

## 2017-10-23 DIAGNOSIS — D573 Sickle-cell trait: Secondary | ICD-10-CM | POA: Insufficient documentation

## 2017-10-23 DIAGNOSIS — L309 Dermatitis, unspecified: Secondary | ICD-10-CM | POA: Insufficient documentation

## 2017-10-23 DIAGNOSIS — J45909 Unspecified asthma, uncomplicated: Secondary | ICD-10-CM | POA: Insufficient documentation

## 2017-10-23 DIAGNOSIS — E119 Type 2 diabetes mellitus without complications: Secondary | ICD-10-CM | POA: Insufficient documentation

## 2017-10-23 MED ORDER — PANTOPRAZOLE SODIUM 40 MG PO TBEC: 40.0000 mg | DELAYED_RELEASE_TABLET | Freq: Every day | ORAL | 1 refills | Status: DC

## 2017-10-23 NOTE — Patient Instructions (Signed)
Start Protonix 40 mg daily for uncontrolled reflux Continue Zyrtec 10 mg at night Continue Flovent 110 mcg 2 puffs twice daily rinse after every use Albuterol as needed Use facemask as needed Avoid triggers and secondhand smoke

## 2017-10-23 NOTE — Progress Notes (Signed)
Name: Shakeema Lippman MRN: [id number] DOB: 1961-03-09     CONSULTATION DATE: 10/23/2017   REFERRING MD :  Keane Scrape  CHIEF COMPLAINT:   STUDIES:  CXR 08/2017 I have Independently reviewed images of  CXR   on 10/23/2017 Interpretation:no acute opacities No effusions    HISTORY OF PRESENT ILLNESS: 56 yo AAF seen today for chronic cough Patient has had a dry nonproductive cough for the past 6 months Patient was referred to ENT for further evaluation and from what I can tell patient does not have any acute issues per ENT Patient was diagnosed with asthma approximately 15 years ago Patient was prescribed an inhaled steroid Flovent 110 mcg Patient uses nebulized albuterol as needed and it helps her cough Patient does have signs and symptoms of allergic rhinitis Patient has signs and symptoms of uncontrolled reflux and is on Prilosec at this time Patient is exposed to carpeting at home as well as a dog Patient is a former smoker 1/2 pack/day for 40 years Patient is constantly being exposed to secondhand smoke and there are just hand smoke  Chest x-ray in October 2018 shows no acute findings Patient states she has a sore neck and sore throat from coughing  No signs of acute infection at this time No signs of acute respiratory failure at this time No signs of acute heart failure at this time   Patient has tried prednisone therapy in the past and she refuses any type of prednisone therapy at this time   PAST MEDICAL HISTORY :   has a past medical history of Anemia, Asthma, Diabetes mellitus without complication ([place]), Eczema, Fibromyalgia, GERD (gastroesophageal reflux disease), History of bronchitis, History of pneumonia, Hyperlipidemia, Hypertension, Hypertriglyceridemia, Shortness of breath dyspnea, Sickle cell trait ([place]), Sleep apnea, Stroke ([place]), and Urinary frequency.  has a past surgical history that includes Colonoscopy with propofol (N/A, 04/21/2015); Abdominal  hysterectomy; Diagnostic laparoscopy; Cesarean section; Breast reduction surgery (Bilateral, 09/21/2015); Breast reduction surgery (Bilateral, 09/21/2015); and Reduction mammaplasty (Bilateral, 2016). Prior to Admission medications   Medication Sig Start Date End Date Taking? Authorizing Provider  acetaminophen (TYLENOL) 500 MG tablet Take 1,000 mg by mouth every 8 (eight) hours as needed for mild pain or moderate pain.    [provider]  albuterol (PROVENTIL HFA;VENTOLIN HFA) 108 (90 BASE) MCG/ACT inhaler Inhale 2 puffs into the lungs every 6 (six) hours as needed for wheezing or shortness of breath.    [provider]  busPIRone (BUSPAR) 5 MG tablet Take 5 mg by mouth 3 (three) times daily.    [provider]  Cyanocobalamin (B-12 PO) Take 1 mL by mouth daily.     [provider]  docusate sodium (COLACE) 100 MG capsule Take 1 capsule (100 mg total) by mouth daily. 09/22/15   Crissie Reese, MD  DULoxetine (CYMBALTA) 60 MG capsule Take 60 mg by mouth daily. 03/22/15   [provider]  HYDROcodone-acetaminophen (NORCO/VICODIN) 5-325 MG tablet Take 1 tablet by mouth every 4 (four) hours as needed. 05/29/17   Harvest Dark, MD  LEVEMIR FLEXTOUCH 100 UNIT/ML Pen Inject 70 Units into the skin at bedtime.  03/17/15   [provider]  Liraglutide (VICTOZA) 18 MG/3ML SOPN Inject into the skin.    [provider]  lisinopril (PRINIVIL,ZESTRIL) 10 MG tablet Take 1 tablet by mouth daily. 03/17/15   [provider]  methocarbamol (ROBAXIN) 500 MG tablet Take 1 tablet (500 mg total) by mouth every 6 (six) hours as needed for muscle  spasms. Patient not taking: Reported on 05/29/2017 09/22/15   Crissie Reese, MD  omeprazole (PRILOSEC) 20 MG capsule Take 1 capsule by mouth daily. 04/04/15   [provider]  triamcinolone cream (KENALOG) 0.1 % Apply 1 application topically 2 (two) times daily as needed. rash 01/12/15   [provider]   Allergies  Allergen Reactions  . Aspirin   . Erythromycin Hives  . Morphine And Related Nausea And Vomiting  . Orange Fruit [Citrus] Hives    "orange juice"  . Penicillins Hives    Has patient had a PCN reaction causing immediate rash, facial/tongue/throat swelling, SOB or lightheadedness with hypotension: Unknown Has patient had a PCN reaction causing severe rash involving mucus membranes or skin necrosis: Unknown Has patient had a PCN reaction that required hospitalization: Unknown Has patient had a PCN reaction occurring within the last 10 years: Unknown If all of the above answers are "NO", then may proceed with Cephalosporin use.   . Pork-Derived Products Hives  . Tetracyclines & Related Hives  . Tomato     FAMILY HISTORY:  family history includes Breast cancer (age of onset: 81) in her mother; Diabetes in her father; Lung cancer in her father. SOCIAL HISTORY:  reports that she quit smoking about 8 years ago. she has never used smokeless tobacco. She reports that she drinks alcohol. She reports that she does not use drugs.  REVIEW OF SYSTEMS:   Constitutional: Negative for fever, chills, weight loss, +malaise/fatigue and diaphoresis.  HENT: Negative for hearing loss, ear pain, nosebleeds, congestion, sore throat, neck pain, tinnitus and ear discharge.   Eyes: Negative for blurred vision, double vision, photophobia, pain, discharge and redness.  Respiratory: +cough, hemoptysis, -sputum production, -shortness of breath, +wheezing and stridor.   Cardiovascular: Negative for chest pain, palpitations, orthopnea, claudication, leg swelling and PND.  Gastrointestinal: Negative for heartburn, nausea, vomiting, abdominal pain, diarrhea, constipation, blood in stool and melena.  Genitourinary: Negative for dysuria, urgency, frequency, hematuria and flank pain.  Musculoskeletal: Negative for myalgias, back pain, joint pain and falls.  Skin: Negative for itching and rash.    Neurological: Negative for dizziness, tingling, tremors, sensory change, speech change, focal weakness, seizures, loss of consciousness, weakness and headaches.  Endo/Heme/Allergies: + environmental allergies and polydipsia. Does not bruise/bleed easily.  ALL OTHER ROS ARE NEGATIVE    VITAL SIGNS: BP (!) 142/90 (BP Location: Left Arm, Cuff Size: Normal)   Pulse 90   Resp 16   Ht 5\' 6"  (1.676 m)   Wt 214 lb (97.1 kg)   SpO2 98%   BMI 34.54 kg/m   Physical Examination:   GENERAL:NAD, no fevers, chills, no weakness no fatigue HEAD: Normocephalic, atraumatic.  EYES: Pupils equal, round, reactive to light. Extraocular muscles intact. No scleral icterus.  MOUTH: Moist mucosal membrane.   EAR, NOSE, THROAT: Clear without exudates. No external lesions. +neck pain upon palpation NECK: Supple. No thyromegaly. No nodules. No JVD.  PULMONARY:CTA B/L no wheezes, no crackles, no rhonchi CARDIOVASCULAR: S1 and S2. Regular rate and rhythm. No murmurs, rubs, or gallops. No edema.  GASTROINTESTINAL: Soft, nontender, nondistended. No masses. Positive bowel sounds.  MUSCULOSKELETAL: No swelling, clubbing, or edema. Range of motion full in all extremities.  NEUROLOGIC: Cranial nerves II through XII are intact. No gross focal neurological deficits.  SKIN: No ulceration, lesions, rashes, or cyanosis. Skin warm and dry. Turgor intact.  PSYCHIATRIC: Mood, affect within normal limits. The patient is awake, alert and oriented x 3. Insight, judgment intact.  ASSESSMENT / PLAN: 56 year old pleasant African-American female seen today for chronic cough in the setting of previous diagnosis of reactive airways disease with asthma along with chronic allergic rhinitis in the setting of obesity and deconditioned state along with environmental exposure to her dog as well as extensive carpeting at home with secondhand and thirdhand smoke exposure from her husband.  Patient also states that her gastrointestinal  reflux disease is not under control  #1 Avoid triggers and avoid allergens(dogs, carpet,smoke)  #2 change Prilosec to Protonix 40 mg daily-this needs to be better controlled  #3 continue antihistamine therapy with Zyrtec 10 mg daily  #4 continue inhaled steroids with Flovent 110 mcg 2 puffs twice daily  #5 we will need to obtain pulmonary function testing prior to next visit to assess underlying obstructive airways disease and COPD  #6 continue albuterol nebs and MDIs as needed   #7 I have advised patient to use facemask around her pet dog  #8 Obesity -recommend significant weight loss -recommend changing diet  #9Deconditioned state -Recommend increased daily activity and exercise  #10 advil as needed for throat pain  #11 cough suppressant as needed    Patient satisfied with Plan of action and management. All questions answered Follow up in 1 month   Damain Broadus Patricia Pesa, M.D.  [facility]  Medical Director [place] Director [facility] Cardio-Pulmonary Department

## 2017-11-13 ENCOUNTER — Ambulatory Visit: Payer: BLUE CROSS/BLUE SHIELD | Attending: Internal Medicine

## 2017-11-13 DIAGNOSIS — J4520 Mild intermittent asthma, uncomplicated: Secondary | ICD-10-CM

## 2017-11-13 MED ORDER — ALBUTEROL SULFATE (2.5 MG/3ML) 0.083% IN NEBU
2.5000 mg | INHALATION_SOLUTION | Freq: Once | RESPIRATORY_TRACT | Status: AC
  Administered 2017-11-13: 2.5 mg via RESPIRATORY_TRACT
  Filled 2017-11-13: qty 3

## 2017-12-15 ENCOUNTER — Other Ambulatory Visit: Payer: Self-pay | Admitting: *Deleted

## 2017-12-15 ENCOUNTER — Ambulatory Visit: Payer: BLUE CROSS/BLUE SHIELD | Admitting: Internal Medicine

## 2017-12-15 ENCOUNTER — Encounter: Payer: Self-pay | Admitting: Internal Medicine

## 2017-12-15 VITALS — BP 102/70 | HR 83 | Resp 16 | Ht 66.0 in | Wt 214.0 lb

## 2017-12-15 DIAGNOSIS — J4520 Mild intermittent asthma, uncomplicated: Secondary | ICD-10-CM | POA: Diagnosis not present

## 2017-12-15 MED ORDER — ZOLPIDEM TARTRATE 5 MG PO TABS: 5.0000 mg | ORAL_TABLET | Freq: Every evening | ORAL | 3 refills | Status: DC | PRN

## 2017-12-15 NOTE — Addendum Note (Signed)
Addended by: Stephanie Coup on: 12/15/2017 02:07 PM   Modules accepted: Orders

## 2017-12-15 NOTE — Progress Notes (Signed)
Name: Kristen Harris MRN: [id number] DOB: 1961-02-17     CONSULTATION DATE: 12/15/2017   REFERRING MD :  Keane Scrape  CHIEF COMPLAINT:   STUDIES:  CXR 08/2017 Interpretation:no acute opacities No effusions    HISTORY OF PRESENT ILLNESS: Patient feels better with FLovent Less SOB Has lost some weight More active, goes to gym Still exposed to dogs,carpet and smoke  Chest x-ray in October 2018 shows no acute findings  No signs of acute infection at this time No signs of acute respiratory failure at this time No signs of acute heart failure at this time   Patient has tried prednisone therapy in the past and she refuses any type of prednisone therapy at this time  PFT's Ratio is 65% FEV1 is 57% predicted 1.5 L FVC is 71% predicted Interpretation moderate obstructive airways disease with significant bronchodilator response with normal diffusion when adjusted for volumes with mild restrictive lung disease most likely related to her obesity    REVIEW OF SYSTEMS:   Constitutional: Negative for fever, chills, weight loss, +malaise/fatigue and diaphoresis.  HENT: Negative for hearing loss, ear pain, nosebleeds, congestion, sore throat, neck pain, tinnitus and ear discharge.   Eyes: Negative for blurred vision, double vision, photophobia, pain, discharge and redness.  Respiratory: +cough, hemoptysis, -sputum production,  Cardiovascular: Negative for chest pain, palpitations, orthopnea, claudication, leg swelling and PND.  ALL OTHER ROS ARE NEGATIVE    VITAL SIGNS: Ht 5\' 6"  (1.676 m)   BMI 34.54 kg/m   Physical Examination:   GENERAL:NAD, no fevers, chills, no weakness no fatigue HEAD: Normocephalic, atraumatic.  EYES: Pupils equal, round, reactive to light. Extraocular muscles intact. No scleral icterus.  MOUTH: Moist mucosal membrane.   EAR, NOSE, THROAT: Clear without exudates. No external lesions. +neck pain upon palpation NECK: Supple. No thyromegaly. No  nodules. No JVD.  PULMONARY:CTA B/L no wheezes, no crackles, no rhonchi CARDIOVASCULAR: S1 and S2. Regular rate and rhythm. No murmurs, rubs, or gallops. No edema.  GASTROINTESTINAL: Soft, nontender, nondistended. No masses. Positive bowel sounds.  MUSCULOSKELETAL: No swelling, clubbing, or edema. Range of motion full in all extremities.  NEUROLOGIC: Cranial nerves II through XII are intact. No gross focal neurological deficits.  SKIN: No ulceration, lesions, rashes, or cyanosis. Skin warm and dry. Turgor intact.  PSYCHIATRIC: Mood, affect within normal limits. The patient is awake, alert and oriented x 3. Insight, judgment intact.     ASSESSMENT / PLAN: 57 year old pleasant African-American female seen today for chronic cough in the setting of previous diagnosis of reactive airways disease with COPD/asthma along with chronic allergic rhinitis in the setting of obesity and deconditioned state along with environmental exposure to her dog as well as extensive carpeting at home with secondhand and thirdhand smoke exposure from her husband.  Patient also states that her gastrointestinal reflux disease is not under control  #1 Avoid triggers and avoid allergens(dogs, carpet,smoke)  #2  Protonix 40 mg daily-  #3 continue antihistamine therapy with Zyrtec 10 mg daily  #4 continue inhaled steroids with Flovent 110 mcg 2 puffs twice daily  #5  Moderate obstructive airways disease COPD Gold stage a Continue inhaled steroids as prescribed  #6 continue albuterol nebs and MDIs as needed   #7 I have advised patient to use facemask around her pet dog  #8 Obesity -recommend significant weight loss -recommend changing diet  #9Deconditioned state -Recommend increased daily activity and exercise  #10 advil as needed for throat pain  #11 cough suppressant as needed  #12  [facility]-will need compliance report Refill of ambien requested  Patient satisfied with Plan of action and management. All  questions answered Follow up in 6 months   Watt Geiler Patricia Pesa, M.D.  [facility]  Medical Director [place] Director [facility] Cardio-Pulmonary Department

## 2017-12-15 NOTE — Patient Instructions (Signed)
Continue inhalers as prescribed 

## 2017-12-16 ENCOUNTER — Other Ambulatory Visit: Payer: Self-pay | Admitting: Internal Medicine

## 2018-05-10 ENCOUNTER — Other Ambulatory Visit: Payer: Self-pay | Admitting: Internal Medicine

## 2018-06-11 ENCOUNTER — Ambulatory Visit: Payer: BLUE CROSS/BLUE SHIELD | Admitting: Internal Medicine

## 2018-06-12 ENCOUNTER — Encounter: Payer: Self-pay | Admitting: Internal Medicine

## 2018-07-27 ENCOUNTER — Other Ambulatory Visit: Payer: Self-pay | Admitting: Physician Assistant

## 2018-07-27 ENCOUNTER — Ambulatory Visit: Payer: BLUE CROSS/BLUE SHIELD | Admitting: Internal Medicine

## 2018-07-27 ENCOUNTER — Encounter: Payer: Self-pay | Admitting: Internal Medicine

## 2018-07-27 VITALS — BP 138/88 | HR 78 | Ht 66.0 in | Wt 208.0 lb

## 2018-07-27 DIAGNOSIS — Z1231 Encounter for screening mammogram for malignant neoplasm of breast: Secondary | ICD-10-CM

## 2018-07-27 DIAGNOSIS — K219 Gastro-esophageal reflux disease without esophagitis: Secondary | ICD-10-CM | POA: Diagnosis not present

## 2018-07-27 DIAGNOSIS — J449 Chronic obstructive pulmonary disease, unspecified: Secondary | ICD-10-CM

## 2018-07-27 MED ORDER — FLUTICASONE PROPIONATE HFA 220 MCG/ACT IN AERO: 2.0000 | INHALATION_SPRAY | Freq: Two times a day (BID) | RESPIRATORY_TRACT | 2 refills | Status: DC

## 2018-07-27 NOTE — Patient Instructions (Signed)
Increased dose of FLovent Use albuterol 2-4 puffs as needed every 4 hrs as needed for cough/wheezing/sob  Will need GI referral   Avoid second hand smoke Avoid dogs as much as possible  I will need compliance report from CPAP machine

## 2018-07-27 NOTE — Progress Notes (Signed)
Name: Kristen Harris MRN: [id number] DOB: 26-Feb-1961     CONSULTATION DATE: 07/27/2018   REFERRING MD :  Keane Scrape  CHIEF COMPLAINT:   STUDIES:  CXR 08/2017 Interpretation:no acute opacities No effusions    HISTORY OF PRESENT ILLNESS: Patient is noncompliant with flovent Less SOB, some productive cough  Still exposed to dogs,carpet and smoke Symptoms of allergies to dogs  No signs of acute infection at this time No signs of acute respiratory failure at this time No signs of acute heart failure at this time   Patient has tried prednisone therapy in the past and she refuses any type of prednisone therapy at this time  PFT's Ratio is 65% FEV1 is 57% predicted 1.5 L FVC is 71% predicted Interpretation moderate obstructive airways disease with significant bronchodilator response with normal diffusion when adjusted for volumes with mild restrictive lung disease most likely related to her obesity  She has a dx fo sleep apnea and uses nasal mask I will need compliance report  She also has h/o reflux on PPI but still NOT under control Advised to be seen by GI  REVIEW OF SYSTEMS:   Constitutional: Negative for fever, chills, weight loss, +malaise/fatigue and diaphoresis.  HENT: Negative for hearing loss, ear pain, nosebleeds, congestion, sore throat, neck pain, tinnitus and ear discharge.   Eyes: Negative for blurred vision, double vision, photophobia, pain, discharge and redness.  Respiratory: +cough, hemoptysis, +sputum production,  Cardiovascular: Negative for chest pain, palpitations, orthopnea, claudication, leg swelling and PND.  ALL OTHER ROS ARE NEGATIVE    VITAL SIGNS: BP 138/88 (BP Location: Left Arm, Cuff Size: Normal)   Pulse 78   Ht 5\' 6"  (1.676 m)   Wt 208 lb (94.3 kg)   SpO2 94%   BMI 33.57 kg/m   Physical Examination:   GENERAL:NAD, no fevers, chills, no weakness no fatigue HEAD: Normocephalic, atraumatic.  EYES: Pupils equal, round, reactive  to light. Extraocular muscles intact. No scleral icterus.  MOUTH: Moist mucosal membrane.   EAR, NOSE, THROAT: Clear without exudates. No external lesions. +neck pain upon palpation NECK: Supple. No thyromegaly. No nodules. No JVD.  PULMONARY:CTA B/L no wheezes, no crackles, no rhonchi CARDIOVASCULAR: S1 and S2. Regular rate and rhythm. No murmurs, rubs, or gallops. No edema.  GASTROINTESTINAL: Soft, nontender, nondistended. No masses. Positive bowel sounds.  MUSCULOSKELETAL: No swelling, clubbing, or edema. Range of motion full in all extremities.  NEUROLOGIC: Cranial nerves II through XII are intact. No gross focal neurological deficits.  SKIN: No ulceration, lesions, rashes, or cyanosis. Skin warm and dry. Turgor intact.  PSYCHIATRIC: Mood, affect within normal limits. The patient is awake, alert and oriented x 3. Insight, judgment intact.      ASSESSMENT / PLAN: 57 year old pleasant African-American female seen today for chronic cough in the setting of previous diagnosis of reactive airways disease with COPD/asthma along with chronic allergic rhinitis in the setting of obesity and deconditioned state along with environmental exposure to her dog as well as extensive carpeting at home with secondhand and thirdhand smoke exposure from her husband.  Patient also states that her gastrointestinal reflux disease is not under control  #1 Avoid triggers and avoid allergens(dogs, carpet,smoke) I have explained that patient will have ongoing symptoms if she continues to be exposed to allergens  #2  Protonix 40 mg daily-reflux is NOT under control and needs GI referral  #3 continue antihistamine therapy with Zyrtec 10 mg daily  #4COPD Moderate-Gold Stage A  it seems that 110 mcg  may be under dosed, will increased flovent HFA to 220 mcg -continue albuterol nebs and MDIs as needed   #6 I have advised patient to use facemask around her pet dog  #7 Obesity -recommend significant weight  loss -recommend changing diet  #8Deconditioned state -Recommend increased daily activity and exercise  #9 OSA-will need compliance report On Lunesta   Patient satisfied with Plan of action and management. All questions answered Follow up in 6 months   Darleny Sem Patricia Pesa, M.D.  [facility]  Medical Director [place] Director [facility] Cardio-Pulmonary Department

## 2018-08-20 ENCOUNTER — Ambulatory Visit
Admission: RE | Admit: 2018-08-20 | Discharge: 2018-08-20 | Disposition: A | Discharge status: Home / Self Care | Payer: BLUE CROSS/BLUE SHIELD | Source: Ambulatory Visit | Attending: Physician Assistant | Admitting: Physician Assistant

## 2018-08-20 DIAGNOSIS — Z1231 Encounter for screening mammogram for malignant neoplasm of breast: Secondary | ICD-10-CM | POA: Diagnosis present

## 2018-08-24 ENCOUNTER — Other Ambulatory Visit: Payer: Self-pay | Admitting: Physician Assistant

## 2018-08-24 DIAGNOSIS — R928 Other abnormal and inconclusive findings on diagnostic imaging of breast: Secondary | ICD-10-CM

## 2018-08-24 DIAGNOSIS — N6320 Unspecified lump in the left breast, unspecified quadrant: Secondary | ICD-10-CM

## 2018-09-02 ENCOUNTER — Ambulatory Visit
Admission: RE | Admit: 2018-09-02 | Discharge: 2018-09-02 | Disposition: A | Discharge status: Home / Self Care | Payer: BLUE CROSS/BLUE SHIELD | Source: Ambulatory Visit | Attending: Physician Assistant | Admitting: Physician Assistant

## 2018-09-02 DIAGNOSIS — N6320 Unspecified lump in the left breast, unspecified quadrant: Secondary | ICD-10-CM

## 2018-09-02 DIAGNOSIS — R928 Other abnormal and inconclusive findings on diagnostic imaging of breast: Secondary | ICD-10-CM | POA: Insufficient documentation

## 2018-09-10 ENCOUNTER — Encounter: Payer: Self-pay | Admitting: Gastroenterology

## 2018-09-10 ENCOUNTER — Ambulatory Visit: Payer: BLUE CROSS/BLUE SHIELD | Admitting: Gastroenterology

## 2018-09-10 ENCOUNTER — Other Ambulatory Visit: Payer: Self-pay

## 2018-09-10 VITALS — BP 109/75 | HR 77 | Resp 17 | Ht 66.0 in | Wt 208.0 lb

## 2018-09-10 DIAGNOSIS — K219 Gastro-esophageal reflux disease without esophagitis: Secondary | ICD-10-CM

## 2018-09-10 DIAGNOSIS — R140 Abdominal distension (gaseous): Secondary | ICD-10-CM

## 2018-09-10 NOTE — Progress Notes (Signed)
Jonathon Bellows MD, MRCP(U.K) [address]  [place]  [place], [place] [address]  Main: [phone]  Fax: [phone]   Gastroenterology Consultation  Referring Provider:     Marinda Elk, MD Primary Care Physician:  Marinda Elk, MD Primary Gastroenterologist:  Dr. Jonathon Bellows  Reason for Consultation:     GERD        HPI:   Kristen Harris is a 57 y.o. y/o female referred for consultation & management  by Dr. Carrie Mew, Wilhemena Durie, MD.    She has been referred for GERD. Previously been seen by [facility] back in 2017 for GERD.   Reflux:  Onset : Decades back , symptoms occur most days of the days week , can be in the am or in the pm  Symptoms: heartburn, tightness of chest , feels full in her stomach , very tight . Has a lot of gas and burping  Recent weight gain: yes - up and down last 1 year  Medications: lunesta  Narcotics or anticholinergics use : no regular use of pain meds  PPI /H2 blockers or Antacid  use and timing :takes protonix in the am , unsure if before or after food.  Dinner time : tries not to eat past 7 , does not eat before bedtime.   Prior EGD: no  Family history of esophageal cancer:mother had esophageal cancer.   Diabetes : Hba1c 7.4  She consumes stevia on and off. Diet coke or mountain dew- not often , sweet tea/gold peak diet - 3 bottles a week - each bottle is 32 ox.     Past Medical History:  Diagnosis Date  . Anemia   . Asthma   . Diabetes mellitus without complication ([place])    type 2  . Eczema   . Fibromyalgia   . GERD (gastroesophageal reflux disease)   . History of bronchitis   . History of pneumonia   . Hyperlipidemia   . Hypertension   . Hypertriglyceridemia   . Shortness of breath dyspnea   . Sickle cell trait ([place])   . Sleep apnea   . Stroke ([place])   . Urinary frequency     Past Surgical History:  Procedure Laterality Date  . ABDOMINAL HYSTERECTOMY    . BREAST REDUCTION SURGERY Bilateral 09/21/2015  .  BREAST REDUCTION SURGERY Bilateral 09/21/2015   Procedure: BILATERAL BREAST REDUCTION USING FREE NIPPLE GRAFT;  Surgeon: Crissie Reese, MD;  Location: [facility];  Service: Plastics;  Laterality: Bilateral;  . CESAREAN SECTION    . COLONOSCOPY WITH PROPOFOL N/A 04/21/2015   Procedure: COLONOSCOPY WITH PROPOFOL;  Surgeon: Josefine Class, MD;  Location: [facility] ENDOSCOPY;  Service: Endoscopy;  Laterality: N/A;  . DIAGNOSTIC LAPAROSCOPY    . REDUCTION MAMMAPLASTY Bilateral 2016    Prior to Admission medications   Medication Sig Start Date End Date Taking? Authorizing Provider  acetaminophen (TYLENOL) 500 MG tablet Take 1,000 mg by mouth every 8 (eight) hours as needed for mild pain or moderate pain.   Yes [provider]  albuterol (PROVENTIL HFA;VENTOLIN HFA) 108 (90 BASE) MCG/ACT inhaler Inhale 2 puffs into the lungs every 6 (six) hours as needed for wheezing or shortness of breath.   Yes [provider]  ARIPiprazole (ABILIFY) 2 MG tablet  08/27/18  Yes [provider]  atorvastatin (LIPITOR) 20 MG tablet Take 20 mg by mouth daily. 08/22/18  Yes [provider]  busPIRone (BUSPAR) 5 MG tablet Take 5 mg by mouth 3 (  three) times daily.   Yes [provider]  CONTOUR NEXT TEST test strip USE TO TEST BLOOD SUGAR TID 07/29/18  Yes [provider]  DULoxetine (CYMBALTA) 60 MG capsule Take 60 mg by mouth daily. 03/22/15  Yes [provider]  Eszopiclone 3 MG TABS Take by mouth. 08/26/18  Yes [provider]  fluticasone (FLOVENT HFA) 220 MCG/ACT inhaler Inhale 2 puffs into the lungs 2 (two) times daily. 07/27/18 07/27/19 Yes Kasa, Maretta Bees, MD  glucose blood (CONTOUR NEXT TEST) test strip TEST BLOOD SUGAR THREE TIMES DAILY 07/28/18  Yes [provider]  glucose blood (PRECISION QID TEST) test strip Use 4 (four) times daily. Use as instructed. 12/23/14 07/28/19 Yes [provider]  glucose blood (PRECISION QID TEST) test strip Use 3  (three) times daily. Bayer contour. Diagnosis: E11.65 07/01/17 07/28/19 Yes [provider]  LEVEMIR FLEXTOUCH 100 UNIT/ML Pen Inject 70 Units into the skin at bedtime.  03/17/15  Yes [provider]  Liraglutide (VICTOZA) 18 MG/3ML SOPN Inject into the skin.   Yes [provider]  lisinopril (PRINIVIL,ZESTRIL) 10 MG tablet Take 1 tablet by mouth daily. 03/17/15  Yes [provider]  losartan (COZAAR) 50 MG tablet  09/06/18  Yes [provider]  methocarbamol (ROBAXIN) 500 MG tablet Take 1 tablet (500 mg total) by mouth every 6 (six) hours as needed for muscle spasms. 09/22/15  Yes Crissie Reese, MD  pantoprazole (PROTONIX) 40 MG tablet take 1 tablet by mouth once daily 05/11/18  Yes Kasa, Maretta Bees, MD  triamcinolone cream (KENALOG) 0.1 % Apply 1 application topically 2 (two) times daily as needed. rash 01/12/15  Yes [provider]  zolpidem (AMBIEN) 5 MG tablet Take 1 tablet (5 mg total) by mouth at bedtime as needed for sleep. 12/15/17  Yes Kasa, Maretta Bees, MD  Cyanocobalamin (B-12 PO) Take 1 mL by mouth daily.     [provider]  docusate sodium (COLACE) 100 MG capsule Take 1 capsule (100 mg total) by mouth daily. Patient not taking: Reported on 09/10/2018 09/22/15   Crissie Reese, MD  HYDROcodone-acetaminophen (NORCO/VICODIN) 5-325 MG tablet Take 1 tablet by mouth every 4 (four) hours as needed. Patient not taking: Reported on 09/10/2018 05/29/17   Harvest Dark, MD  omeprazole (PRILOSEC) 20 MG capsule Take 1 capsule by mouth daily. 04/04/15   [provider]    Family History  Problem Relation Age of Onset  . Breast cancer Mother 63  . Diabetes Father   . Lung cancer Father      Social History   Tobacco Use  . Smoking status: Former Smoker    Last attempt to quit: 07/17/2009    Years since quitting: 9.1  . Smokeless tobacco: Never Used  Substance Use Topics  . Alcohol use: Yes    Comment: socially  . Drug use: No     Allergies as of 09/10/2018 - Review Complete 09/10/2018  Allergen Reaction Noted  . Tramadol  12/11/2016  . Aspirin  04/20/2015  . Bioflavonoids Hives 04/20/2015  . Erythromycin Hives 04/20/2015  . Morphine and related Nausea And Vomiting 09/19/2015  . Orange fruit [citrus] Hives 04/20/2015  . Penicillins Hives 04/20/2015  . Pork-derived products Hives 04/20/2015  . Tetracyclines & related Hives 04/20/2015  . Tomato  04/20/2015  . Pantoprazole  04/02/2016    Review of Systems:    All systems reviewed and negative except where noted in HPI.   Physical Exam:  BP 109/75 (BP Location: Left Arm, Patient  Position: Sitting, Cuff Size: Large)   Pulse 77   Resp 17   Ht 5\' 6"  (1.676 m)   Wt 208 lb (94.3 kg)   BMI 33.57 kg/m  No LMP recorded. Patient has had a hysterectomy. Psych:  Alert and cooperative. Normal mood and affect. General:   Alert,  Well-developed, well-nourished, pleasant and cooperative in NAD Head:  Normocephalic and atraumatic. Eyes:  Sclera clear, no icterus.   Conjunctiva pink. Ears:  Normal auditory acuity. Nose:  No deformity, discharge, or lesions. Mouth:  No deformity or lesions,oropharynx pink & moist. Neck:  Supple; no masses or thyromegaly. Lungs:  Respirations even and unlabored.  Clear throughout to auscultation.   No wheezes, crackles, or rhonchi. No acute distress. Heart:  Regular rate and rhythm; no murmurs, clicks, rubs, or gallops. Abdomen:  Normal bowel sounds.  No bruits.  Soft, non-tender and non-distended without masses, hepatosplenomegaly or hernias noted.  No guarding or rebound tenderness.    Neurologic:  Alert and oriented x3;  grossly normal neurologically. Skin:  Intact without significant lesions or rashes. No jaundice. Lymph Nodes:  No significant cervical adenopathy. Psych:  Alert and cooperative. Normal mood and affect.  Imaging Studies: US Breast Ltd Uni Left Inc Axilla  Result Date: 09/02/2018 CLINICAL DATA:  Patient was  called back from screening mammogram for a possible mass in the left breast. EXAM: DIGITAL DIAGNOSTIC LEFT MAMMOGRAM WITH CAD AND TOMO ULTRASOUND LEFT BREAST COMPARISON:  Previous exam(s). ACR Breast Density Category b: There are scattered areas of fibroglandular density. FINDINGS: There are changes of reduction mammoplasty. On the lateral view there is mild asymmetry seen in the lateral and upper posterior third of the left breast. There is no discrete mass. There are no malignant type microcalcifications. Mammographic images were processed with CAD. On physical exam, I do not palpate a mass in the left breast. Targeted ultrasound is performed, showing normal tissue in the lateral aspect of the left breast. No solid or cystic mass, abnormal shadowing or distortion visualized. IMPRESSION: Probable benign asymmetric fibroglandular tissue in the left breast. RECOMMENDATION: Short-term interval follow-up left mammogram in 6 months is recommended. I have discussed the findings and recommendations with the patient. Results were also provided in writing at the conclusion of the visit. If applicable, a reminder letter will be sent to the patient regarding the next appointment. BI-RADS CATEGORY  3: Probably benign. Electronically Signed   By: Lillia Mountain M.D.   On: 09/02/2018 14:40   Mm Diag Breast Tomo Uni Left  Result Date: 09/02/2018 CLINICAL DATA:  Patient was called back from screening mammogram for a possible mass in the left breast. EXAM: DIGITAL DIAGNOSTIC LEFT MAMMOGRAM WITH CAD AND TOMO ULTRASOUND LEFT BREAST COMPARISON:  Previous exam(s). ACR Breast Density Category b: There are scattered areas of fibroglandular density. FINDINGS: There are changes of reduction mammoplasty. On the lateral view there is mild asymmetry seen in the lateral and upper posterior third of the left breast. There is no discrete mass. There are no malignant type microcalcifications. Mammographic images were processed with CAD. On  physical exam, I do not palpate a mass in the left breast. Targeted ultrasound is performed, showing normal tissue in the lateral aspect of the left breast. No solid or cystic mass, abnormal shadowing or distortion visualized. IMPRESSION: Probable benign asymmetric fibroglandular tissue in the left breast. RECOMMENDATION: Short-term interval follow-up left mammogram in 6 months is recommended. I have discussed the findings and recommendations with the patient. Results were also provided in  writing at the conclusion of the visit. If applicable, a reminder letter will be sent to the patient regarding the next appointment. BI-RADS CATEGORY  3: Probably benign. Electronically Signed   By: Lillia Mountain M.D.   On: 09/02/2018 14:40   Mm 3d Screen Breast Bilateral  Result Date: 08/20/2018 CLINICAL DATA:  Screening. EXAM: DIGITAL SCREENING BILATERAL MAMMOGRAM WITH TOMO AND CAD COMPARISON:  Previous exam(s). ACR Breast Density Category b: There are scattered areas of fibroglandular density. FINDINGS: In the left breast, a possible mass warrants further evaluation. In the right breast, no findings suspicious for malignancy. Images were processed with CAD. IMPRESSION: Further evaluation is suggested for possible mass in the left breast. RECOMMENDATION: Diagnostic mammogram and possibly ultrasound of the left breast. (Code:[id number]) The patient will be contacted regarding the findings, and additional imaging will be scheduled. BI-RADS CATEGORY  0: Incomplete. Need additional imaging evaluation and/or prior mammograms for comparison. Electronically Signed   By: Lovey Newcomer M.D.   On: 08/20/2018 11:07    Assessment and Plan:   Kristen Harris is a 57 y.o. y/o female has been referred for GERD. Long discussion to stop use of all artificial sugars as it produces a lot of gas which is is complaining of . Advised to lose weight - discussed a diet plan . She may have an element of gastroparesis worsening her reflux.   GERD :  Counseled on life style changes, suggest to use PPI first thing in the morning on empty stomach and eat 30 minutes after.if does not work  Add a dose at [employer] on the use of a wedge pillow at night , avoid meals for 2 hours prior to bed time. Weight loss .Will screen for Barrets esophagus   I have discussed alternative options, risks & benefits,  which include, but are not limited to, bleeding, infection, perforation,respiratory complication & drug reaction.  The patient agrees with this plan & written consent will be obtained.     Follow up in 6 weeks   Dr Jonathon Bellows MD,MRCP(U.K)

## 2018-09-10 NOTE — Patient Instructions (Signed)

## 2018-09-21 ENCOUNTER — Other Ambulatory Visit: Payer: Self-pay

## 2018-09-21 ENCOUNTER — Telehealth: Payer: Self-pay

## 2018-09-21 DIAGNOSIS — K219 Gastro-esophageal reflux disease without esophagitis: Secondary | ICD-10-CM

## 2018-09-21 NOTE — Telephone Encounter (Signed)
Patient contacted office to reschedule her EGD from tomorrow 09/22/18 to 10/12/18.  Patient has laryngitis at this time and is unable to talk.  No cancellation fee will be applied since she is cancelling due to medical issue. Referral has been updated.  New procedure ordered.  Thanks [employer]

## 2018-09-22 ENCOUNTER — Ambulatory Visit: Admit: 2018-09-22 | Payer: BLUE CROSS/BLUE SHIELD | Admitting: Gastroenterology

## 2018-09-22 SURGERY — ESOPHAGOGASTRODUODENOSCOPY (EGD) WITH PROPOFOL
Anesthesia: General

## 2018-10-12 ENCOUNTER — Encounter
Admission: RE | Disposition: A | Discharge status: Home / Self Care | Payer: Self-pay | Source: Ambulatory Visit | Attending: Gastroenterology

## 2018-10-12 ENCOUNTER — Encounter: Payer: Self-pay | Admitting: *Deleted

## 2018-10-12 ENCOUNTER — Ambulatory Visit
Admission: RE | Admit: 2018-10-12 | Discharge: 2018-10-12 | Disposition: A | Discharge status: Home / Self Care | Payer: BLUE CROSS/BLUE SHIELD | Source: Ambulatory Visit | Attending: Gastroenterology | Admitting: Gastroenterology

## 2018-10-12 ENCOUNTER — Ambulatory Visit: Payer: BLUE CROSS/BLUE SHIELD | Admitting: Certified Registered Nurse Anesthetist

## 2018-10-12 DIAGNOSIS — Z13810 Encounter for screening for upper gastrointestinal disorder: Secondary | ICD-10-CM | POA: Insufficient documentation

## 2018-10-12 DIAGNOSIS — E785 Hyperlipidemia, unspecified: Secondary | ICD-10-CM | POA: Insufficient documentation

## 2018-10-12 DIAGNOSIS — E119 Type 2 diabetes mellitus without complications: Secondary | ICD-10-CM | POA: Insufficient documentation

## 2018-10-12 DIAGNOSIS — Z87891 Personal history of nicotine dependence: Secondary | ICD-10-CM | POA: Diagnosis not present

## 2018-10-12 DIAGNOSIS — M797 Fibromyalgia: Secondary | ICD-10-CM | POA: Diagnosis not present

## 2018-10-12 DIAGNOSIS — Z794 Long term (current) use of insulin: Secondary | ICD-10-CM | POA: Insufficient documentation

## 2018-10-12 DIAGNOSIS — Z79899 Other long term (current) drug therapy: Secondary | ICD-10-CM | POA: Diagnosis not present

## 2018-10-12 DIAGNOSIS — J45909 Unspecified asthma, uncomplicated: Secondary | ICD-10-CM | POA: Diagnosis not present

## 2018-10-12 DIAGNOSIS — G4730 Sleep apnea, unspecified: Secondary | ICD-10-CM | POA: Insufficient documentation

## 2018-10-12 DIAGNOSIS — D573 Sickle-cell trait: Secondary | ICD-10-CM | POA: Insufficient documentation

## 2018-10-12 DIAGNOSIS — E781 Pure hyperglyceridemia: Secondary | ICD-10-CM | POA: Insufficient documentation

## 2018-10-12 DIAGNOSIS — I10 Essential (primary) hypertension: Secondary | ICD-10-CM | POA: Diagnosis not present

## 2018-10-12 DIAGNOSIS — Z8673 Personal history of transient ischemic attack (TIA), and cerebral infarction without residual deficits: Secondary | ICD-10-CM | POA: Diagnosis not present

## 2018-10-12 DIAGNOSIS — K219 Gastro-esophageal reflux disease without esophagitis: Secondary | ICD-10-CM | POA: Insufficient documentation

## 2018-10-12 HISTORY — DX: Cramp and spasm: R25.2

## 2018-10-12 HISTORY — PX: ESOPHAGOGASTRODUODENOSCOPY (EGD) WITH PROPOFOL: SHX5813

## 2018-10-12 LAB — GLUCOSE, CAPILLARY: Glucose-Capillary: 105 mg/dL — ABNORMAL HIGH (ref 70–99)

## 2018-10-12 SURGERY — ESOPHAGOGASTRODUODENOSCOPY (EGD) WITH PROPOFOL
Anesthesia: General

## 2018-10-12 MED ORDER — PROPOFOL 500 MG/50ML IV EMUL
INTRAVENOUS | Status: AC
  Filled 2018-10-12: qty 50

## 2018-10-12 MED ORDER — PROPOFOL 10 MG/ML IV BOLUS
INTRAVENOUS | Status: DC | PRN
  Administered 2018-10-12: 20 mg via INTRAVENOUS
  Administered 2018-10-12: 40 mg via INTRAVENOUS

## 2018-10-12 MED ORDER — PROPOFOL 10 MG/ML IV BOLUS
INTRAVENOUS | Status: AC
  Filled 2018-10-12: qty 20

## 2018-10-12 MED ORDER — SODIUM CHLORIDE 0.9 % IV SOLN
INTRAVENOUS | Status: DC
  Administered 2018-10-12: 08:00:00 via INTRAVENOUS

## 2018-10-12 MED ORDER — PROPOFOL 500 MG/50ML IV EMUL
INTRAVENOUS | Status: DC | PRN
  Administered 2018-10-12: 100 ug/kg/min via INTRAVENOUS

## 2018-10-12 MED ORDER — LIDOCAINE HCL (CARDIAC) PF 100 MG/5ML IV SOSY
PREFILLED_SYRINGE | INTRAVENOUS | Status: DC | PRN
  Administered 2018-10-12: 50 mg via INTRAVENOUS

## 2018-10-12 NOTE — Op Note (Signed)
[facility] Gastroenterology Patient Name: Kristen Harris Procedure Date: 10/12/2018 8:00 AM MRN: [id number] Account #: [id number] Date of Birth: 14-Apr-1961 Admit Type: Outpatient Age: 57 Room: [facility] ENDO ROOM 4 Gender: Female Note Status: Finalized Procedure:            Upper GI endoscopy Indications:          Screening for Barrett's esophagus Providers:            Jonathon Bellows MD, MD Referring MD:         Precious Bard, MD (Referring MD) Medicines:            Monitored Anesthesia Care Complications:        No immediate complications. Procedure:            Pre-Anesthesia Assessment:                       - Prior to the procedure, a History and Physical was                        performed, and patient medications, allergies and                        sensitivities were reviewed. The patient's tolerance of                        previous anesthesia was reviewed.                       - The risks and benefits of the procedure and the                        sedation options and risks were discussed with the                        patient. All questions were answered and informed                        consent was obtained.                       - ASA Grade Assessment: II - A patient with mild                        systemic disease.                       After obtaining informed consent, the endoscope was                        passed under direct vision. Throughout the procedure,                        the patient's blood pressure, pulse, and oxygen                        saturations were monitored continuously. The Endoscope                        was introduced through the mouth, and advanced to the  third part of duodenum. The upper GI endoscopy was                        accomplished with ease. The patient tolerated the                        procedure well. Findings:      The esophagus was normal.      The stomach was normal.  The examined duodenum was normal. Impression:           - Normal esophagus.                       - Normal stomach.                       - Normal examined duodenum.                       - No specimens collected. Recommendation:       - Discharge patient to home (with escort).                       - Resume previous diet.                       - Continue present medications.                       - Return to my office in 6 weeks. Procedure Code(s):    --- Professional ---                       [phone], Esophagogastroduodenoscopy, flexible, transoral;                        diagnostic, including collection of specimen(s) by                        brushing or washing, when performed (separate procedure) Diagnosis Code(s):    --- Professional ---                       Z13.810, Encounter for screening for upper                        gastrointestinal disorder CPT copyright 2018 [employer]. All rights reserved. The codes documented in this report are preliminary and upon coder review may  be revised to meet current compliance requirements. Jonathon Bellows, MD Jonathon Bellows MD, MD 10/12/2018 8:19:39 AM This report has been signed electronically. Number of Addenda: 0 Note Initiated On: 10/12/2018 8:00 AM      [facility]

## 2018-10-12 NOTE — H&P (Signed)
Jonathon Bellows, MD [address], [place], [place], [place], [address] [address], [place], [place], [place], [address] Phone: [phone]  Fax: [phone]  Primary Care Physician:  Marinda Elk, MD   Pre-Procedure History & Physical: HPI:  Kristen Harris is a 57 y.o. female is here for an endoscopy    Past Medical History:  Diagnosis Date  . Anemia   . Asthma   . Diabetes mellitus without complication ([facility] [place])    type 2  . Eczema   . Fibromyalgia   . GERD (gastroesophageal reflux disease)   . History of bronchitis   . History of pneumonia   . Hyperlipidemia   . Hypertension   . Hypertriglyceridemia   . Shortness of breath dyspnea   . Sickle cell trait ([place])   . Sleep apnea   . Spasms of the hands or feet   . Stroke ([place] [facility])   . Urinary frequency     Past Surgical History:  Procedure Laterality Date  . ABDOMINAL HYSTERECTOMY    . BREAST REDUCTION SURGERY Bilateral 09/21/2015  . BREAST REDUCTION SURGERY Bilateral 09/21/2015   Procedure: BILATERAL BREAST REDUCTION USING FREE NIPPLE GRAFT;  Surgeon: Crissie Reese, MD;  Location: [facility];  Service: Plastics;  Laterality: Bilateral;  . CESAREAN SECTION    . COLONOSCOPY WITH PROPOFOL N/A 04/21/2015   Procedure: COLONOSCOPY WITH PROPOFOL;  Surgeon: Josefine Class, MD;  [facility]COPY;  Service: Endoscopy;  Laterality: N/A;  . DIAGNOSTIC LAPAROSCOPY    . REDUCTION MAMMAPLASTY Bilateral 2016    Prior to Admission medications   Medication Sig Start Date End Date Taking? Authorizing Provider  albuterol (PROVENTIL HFA;VENTOLIN HFA) 108 (90 BASE) MCG/ACT inhaler Inhale 2 puffs into the lungs every 6 (six) hours as needed for wheezing or shortness of breath.   Yes [provider]  atorvastatin (LIPITOR) 20 MG tablet Take 20 mg by mouth daily. 08/22/18  Yes [provider]  insulin aspart (NOVOLOG) 100 UNIT/ML injection Inject 18 Units into the skin 3 (three) times daily before meals.    Yes  [provider]  Liraglutide (VICTOZA) 18 MG/3ML SOPN Inject into the skin.   Yes [provider]  lisinopril (PRINIVIL,ZESTRIL) 10 MG tablet Take 1 tablet by mouth daily. 03/17/15  Yes [provider]  losartan (COZAAR) 50 MG tablet  09/06/18  Yes [provider]  methocarbamol (ROBAXIN) 500 MG tablet Take 1 tablet (500 mg total) by mouth every 6 (six) hours as needed for muscle spasms. 09/22/15  Yes Crissie Reese, MD  omeprazole (PRILOSEC) 20 MG capsule Take 1 capsule by mouth daily. 04/04/15  Yes [provider]  acetaminophen (TYLENOL) 500 MG tablet Take 1,000 mg by mouth every 8 (eight) hours as needed for mild pain or moderate pain.    [provider]  ARIPiprazole (ABILIFY) 2 MG tablet  08/27/18   [provider]  busPIRone (BUSPAR) 5 MG tablet Take 5 mg by mouth 3 (three) times daily.    [provider]  CONTOUR NEXT TEST test strip USE TO TEST BLOOD SUGAR TID 07/29/18   [provider]  Cyanocobalamin (B-12 PO) Take 1 mL by mouth daily.     [provider]  docusate sodium (COLACE) 100 MG capsule Take 1 capsule (100 mg total) by mouth daily. Patient not taking: Reported on 09/10/2018 09/22/15   Crissie Reese, MD  DULoxetine (CYMBALTA) 60 MG capsule Take 60 mg by mouth daily. 03/22/15   [provider]  Eszopiclone 3 MG TABS Take by mouth. 08/26/18   [provider]  fluticasone (FLOVENT HFA) 220 MCG/ACT inhaler Inhale 2 puffs into the lungs 2 (two) times daily. 07/27/18 07/27/19  Flora Lipps, MD  glucose blood (CONTOUR NEXT TEST) test strip TEST BLOOD SUGAR THREE TIMES DAILY 07/28/18   [provider]  glucose blood (PRECISION QID TEST) test strip Use 4 (four) times daily. Use as instructed. 12/23/14 07/28/19  [provider]  glucose blood (PRECISION QID TEST) test strip Use 3 (three) times daily. Bayer contour. Diagnosis: E11.65 07/01/17 07/28/19  [provider]    HYDROcodone-acetaminophen (NORCO/VICODIN) 5-325 MG tablet Take 1 tablet by mouth every 4 (four) hours as needed. Patient not taking: Reported on 09/10/2018 05/29/17   Harvest Dark, MD  LEVEMIR FLEXTOUCH 100 UNIT/ML Pen Inject 70 Units into the skin at bedtime.  03/17/15   [provider]  pantoprazole (PROTONIX) 40 MG tablet take 1 tablet by mouth once daily 05/11/18   Flora Lipps, MD  triamcinolone cream (KENALOG) 0.1 % Apply 1 application topically 2 (two) times daily as needed. rash 01/12/15   [provider]  zolpidem (AMBIEN) 5 MG tablet Take 1 tablet (5 mg total) by mouth at bedtime as needed for sleep. Patient not taking: Reported on 10/12/2018 12/15/17   Flora Lipps, MD    Allergies as of 09/21/2018 - Review Complete 09/10/2018  Allergen Reaction Noted  . Tramadol  12/11/2016  . Aspirin  04/20/2015  . Bioflavonoids Hives 04/20/2015  . Erythromycin Hives 04/20/2015  . Morphine and related Nausea And Vomiting 09/19/2015  . Orange fruit [citrus] Hives 04/20/2015  . Penicillins Hives 04/20/2015  . Pork-derived products Hives 04/20/2015  . Tetracyclines & related Hives 04/20/2015  . Tomato  04/20/2015  . Pantoprazole  04/02/2016    Family History  Problem Relation Age of Onset  . Breast cancer Mother 12  . Diabetes Father   . Lung cancer Father     Social History   Socioeconomic History  . Marital status: Married    Spouse name: Not on file  . Number of children: Not on file  . Years of education: Not on file  . Highest education level: Not on file  Occupational History  . Not on file  Social Needs  . Financial resource strain: Not on file  . Food insecurity:    Worry: Not on file    Inability: Not on file  . Transportation needs:    Medical: Not on file    Non-medical: Not on file  Tobacco Use  . Smoking status: Former Smoker    Last attempt to quit: 07/17/2009    Years since quitting: 9.2  . Smokeless tobacco: Never Used  Substance and  Sexual Activity  . Alcohol use: Yes    Comment: socially  . Drug use: No  . Sexual activity: Not on file  Lifestyle  . Physical activity:    Days per week: Not on file    Minutes per session: Not on file  . Stress: Not on file  Relationships  . Social connections:    Talks on phone: Not on file    Gets together: Not on file    Attends religious service: Not on file    Active member of club or organization: Not on file    Attends meetings of clubs or organizations: Not on file    Relationship status: Not on file  . Intimate partner violence:    Fear of current or ex  partner: Not on file    Emotionally abused: Not on file    Physically abused: Not on file    Forced sexual activity: Not on file  Other Topics Concern  . Not on file  Social History Narrative  . Not on file    Review of Systems: See HPI, otherwise negative ROS  Physical Exam: There were no vitals taken for this visit. General:   Alert,  pleasant and cooperative in NAD Head:  Normocephalic and atraumatic. Neck:  Supple; no masses or thyromegaly. Lungs:  Clear throughout to auscultation, normal respiratory effort.    Heart:  +S1, +S2, Regular rate and rhythm, No edema. Abdomen:  Soft, nontender and nondistended. Normal bowel sounds, without guarding, and without rebound.   Neurologic:  Alert and  oriented x4;  grossly normal neurologically.  Impression/Plan: Kristen Harris is here for an endoscopy  to be performed for  evaluation of barrettes esophagus    Risks, benefits, limitations, and alternatives regarding endoscopy have been reviewed with the patient.  Questions have been answered.  All parties agreeable.   Jonathon Bellows, MD  10/12/2018, 8:01 AM

## 2018-10-12 NOTE — Anesthesia Post-op Follow-up Note (Signed)
Anesthesia QCDR form completed.        

## 2018-10-12 NOTE — Anesthesia Preprocedure Evaluation (Addendum)
Anesthesia Evaluation  Patient identified by MRN, date of birth, ID band Patient awake    Reviewed: Allergy & Precautions, H&P , NPO status , Patient's Chart, lab work & pertinent test results  Airway Mallampati: III       Dental  (+) Upper Dentures   Pulmonary shortness of breath, asthma , sleep apnea , former smoker,           Cardiovascular hypertension, (-) angina(-) Past MI, (-) Cardiac Stents and (-) CHF (-) dysrhythmias      Neuro/Psych  Headaches,  Neuromuscular disease CVA (years ago), No Residual Symptoms negative psych ROS   GI/Hepatic Neg liver ROS, GERD  ,  Endo/Other  diabetes  Renal/GU negative Renal ROS  negative genitourinary   Musculoskeletal  (+) Fibromyalgia -  Abdominal   Peds  Hematology  (+) Blood dyscrasia, anemia ,   Anesthesia Other Findings Past Medical History: No date: Anemia No date: Asthma No date: Diabetes mellitus without complication ([facility])     Comment:  type 2 No date: Eczema No date: Fibromyalgia No date: GERD (gastroesophageal reflux disease) No date: History of bronchitis No date: History of pneumonia No date: Hyperlipidemia No date: Hypertension No date: Hypertriglyceridemia No date: Shortness of breath dyspnea No date: Sickle cell trait ([facility]) No date: Sleep apnea No date: Spasms of the hands or feet No date: Stroke [facility]) No date: Urinary frequency  Past Surgical History: No date: ABDOMINAL HYSTERECTOMY 09/21/2015: BREAST REDUCTION SURGERY; Bilateral 09/21/2015: BREAST REDUCTION SURGERY; Bilateral     Comment:  Procedure: BILATERAL BREAST REDUCTION USING FREE NIPPLE               GRAFT;  Surgeon: Crissie Reese, MD;  Location: [facility];                Service: Plastics;  Laterality: Bilateral; No date: CESAREAN SECTION 04/21/2015: COLONOSCOPY WITH PROPOFOL; N/A     Comment:  Procedure: COLONOSCOPY WITH PROPOFOL;  Surgeon: Josefine Class, MD;  Location:  [facility] ENDOSCOPY;  Service:               Endoscopy;  Laterality: N/A; No date: DIAGNOSTIC LAPAROSCOPY 2016: REDUCTION MAMMAPLASTY; Bilateral     Reproductive/Obstetrics negative OB ROS                           Anesthesia Physical Anesthesia Plan  ASA: III  Anesthesia Plan: General   Post-op Pain Management:    Induction:   PONV Risk Score and Plan: Propofol infusion and TIVA  Airway Management Planned: Natural Airway and Nasal Cannula  Additional Equipment:   Intra-op Plan:   Post-operative Plan:   Informed Consent: I have reviewed the patients History and Physical, chart, labs and discussed the procedure including the risks, benefits and alternatives for the proposed anesthesia with the patient or authorized representative who has indicated his/her understanding and acceptance.   Dental Advisory Given  Plan Discussed with: Anesthesiologist  Anesthesia Plan Comments:         Anesthesia Quick Evaluation

## 2018-10-12 NOTE — Transfer of Care (Signed)
Immediate Anesthesia Transfer of Care Note  Patient: Kristen Harris  Procedure(s) Performed: ESOPHAGOGASTRODUODENOSCOPY (EGD) WITH PROPOFOL (N/A )  Patient Location: PACU  Anesthesia Type:MAC  Level of Consciousness: awake, alert  and oriented  Airway & Oxygen Therapy: Patient Spontanous Breathing and Patient connected to nasal cannula oxygen  Post-op Assessment: Report given to RN and Post -op Vital signs reviewed and stable  Post vital signs: Reviewed and stable  Last Vitals:  Vitals Value Taken Time  BP 106/70 10/12/2018  8:21 AM  Temp 36.3 C 10/12/2018  8:21 AM  Pulse 88 10/12/2018  8:22 AM  Resp 22 10/12/2018  8:22 AM  SpO2 92 % 10/12/2018  8:22 AM  Vitals shown include unvalidated device data.  Last Pain:  Vitals:   10/12/18 0821  TempSrc: Tympanic  PainSc: Asleep         Complications: No apparent anesthesia complications

## 2018-10-13 ENCOUNTER — Encounter: Payer: Self-pay | Admitting: Gastroenterology

## 2018-10-13 NOTE — Anesthesia Postprocedure Evaluation (Signed)
Anesthesia Post Note  Patient: Charise Leinbach  Procedure(s) Performed: ESOPHAGOGASTRODUODENOSCOPY (EGD) WITH PROPOFOL (N/A )  Patient location during evaluation: PACU Anesthesia Type: General Level of consciousness: awake and alert Pain management: pain level controlled Vital Signs Assessment: post-procedure vital signs reviewed and stable Respiratory status: spontaneous breathing, nonlabored ventilation and respiratory function stable Cardiovascular status: blood pressure returned to baseline and stable Postop Assessment: no apparent nausea or vomiting Anesthetic complications: no     Last Vitals:  Vitals:   10/12/18 0831 10/12/18 0841  BP: 123/80 110/75  Pulse: 87 81  Resp: 12 18  Temp:    SpO2: 93% 94%    Last Pain:  Vitals:   10/13/18 0734  TempSrc:   PainSc: [place]

## 2018-10-21 ENCOUNTER — Ambulatory Visit
Admission: RE | Admit: 2018-10-21 | Discharge: 2018-10-21 | Disposition: A | Discharge status: Home / Self Care | Payer: Disability Insurance | Source: Ambulatory Visit | Attending: Family Medicine | Admitting: Family Medicine

## 2018-10-21 ENCOUNTER — Other Ambulatory Visit: Payer: Self-pay | Admitting: Family Medicine

## 2018-10-21 DIAGNOSIS — E1369 Other specified diabetes mellitus with other specified complication: Secondary | ICD-10-CM | POA: Diagnosis present

## 2018-10-21 DIAGNOSIS — M5136 Other intervertebral disc degeneration, lumbar region: Secondary | ICD-10-CM | POA: Diagnosis not present

## 2018-10-21 DIAGNOSIS — M797 Fibromyalgia: Secondary | ICD-10-CM | POA: Insufficient documentation

## 2018-10-28 ENCOUNTER — Ambulatory Visit: Payer: BLUE CROSS/BLUE SHIELD | Admitting: Gastroenterology

## 2018-11-08 ENCOUNTER — Other Ambulatory Visit: Payer: Self-pay | Admitting: Internal Medicine

## 2018-11-08 DIAGNOSIS — J449 Chronic obstructive pulmonary disease, unspecified: Secondary | ICD-10-CM

## 2018-11-24 ENCOUNTER — Other Ambulatory Visit: Payer: Self-pay | Admitting: Physical Medicine and Rehabilitation

## 2018-11-24 DIAGNOSIS — M5416 Radiculopathy, lumbar region: Secondary | ICD-10-CM

## 2018-12-03 ENCOUNTER — Ambulatory Visit: Payer: BLUE CROSS/BLUE SHIELD

## 2019-06-17 IMAGING — MG MM DIGITAL SCREENING BILAT W/ CAD
5 series · 5 of 5 positions shown · non-contrast
Comparison: Previous exam(s).

ACR Breast Density Category a: The breast tissue is almost entirely
fatty.

CLINICAL DATA: Screening. Bilateral reduction mammoplasty in 4490.

EXAM:
DIGITAL SCREENING BILATERAL MAMMOGRAM WITH CAD

[R MLO]
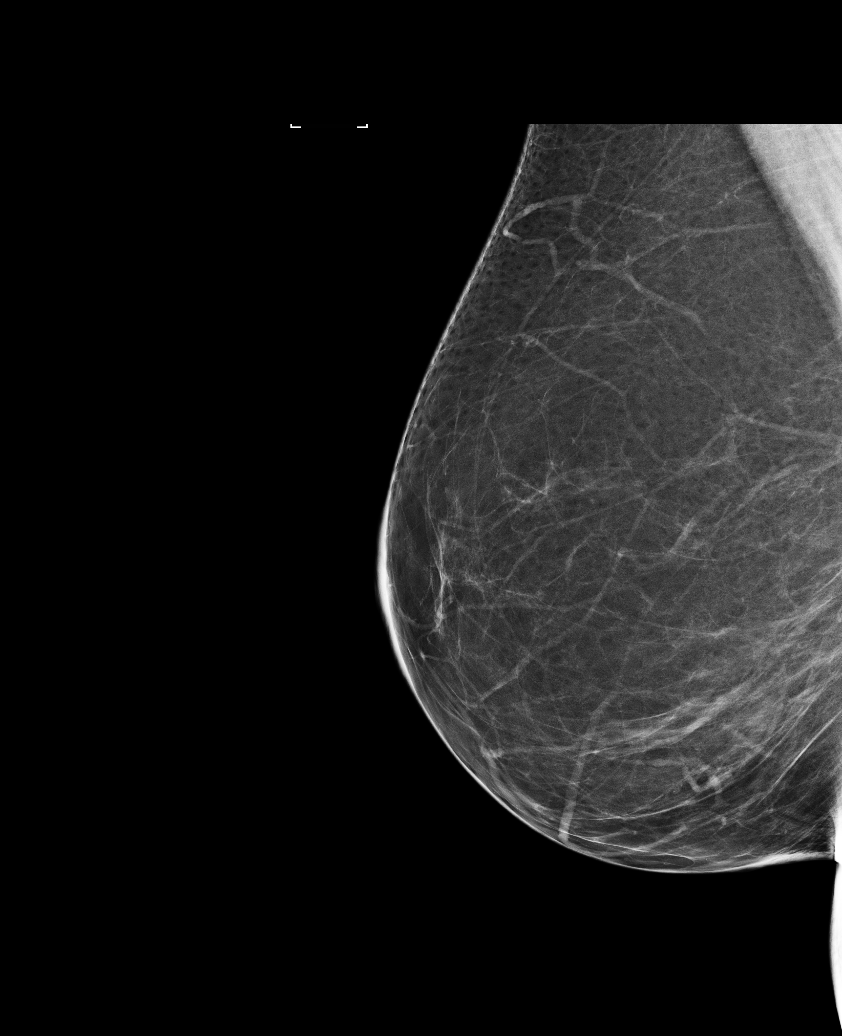

[R CV]
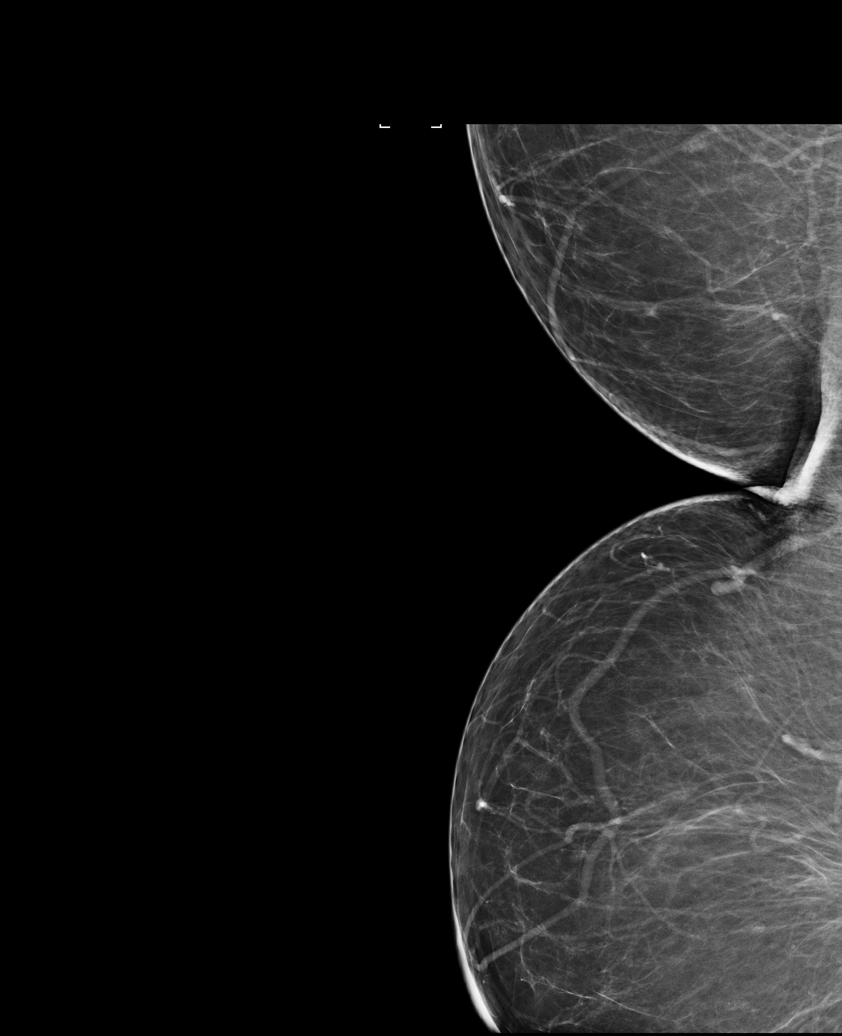

[L CC]
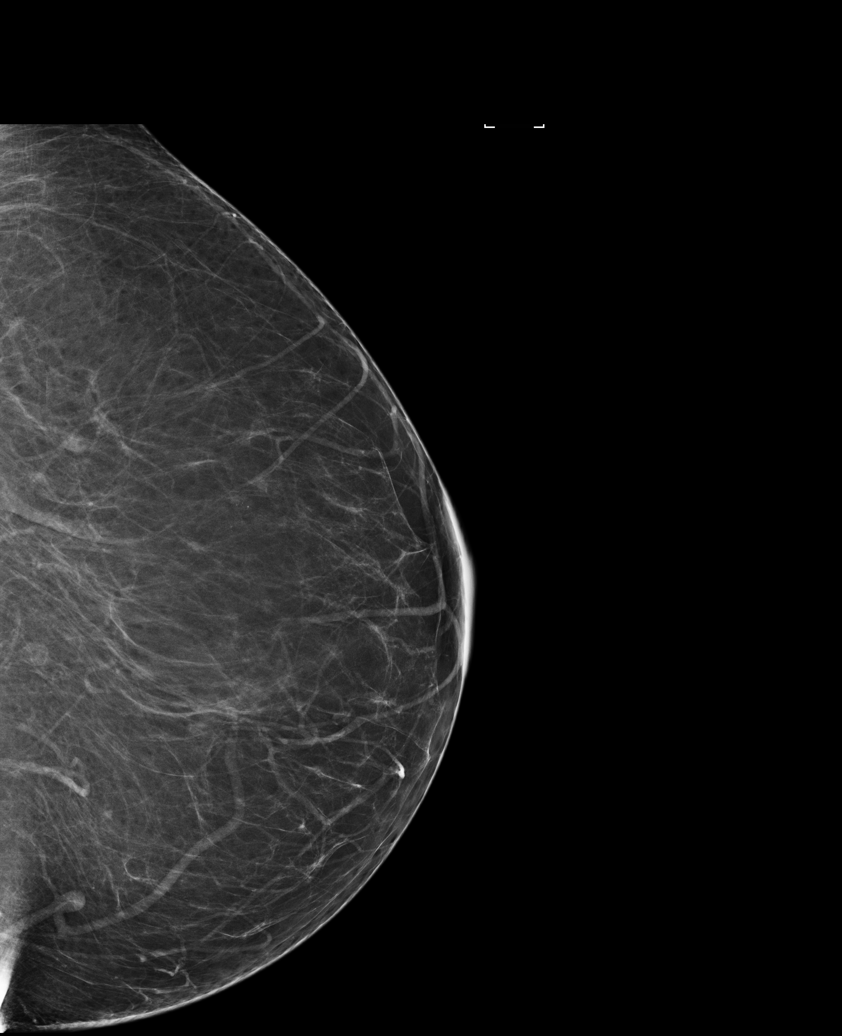

[R CC]
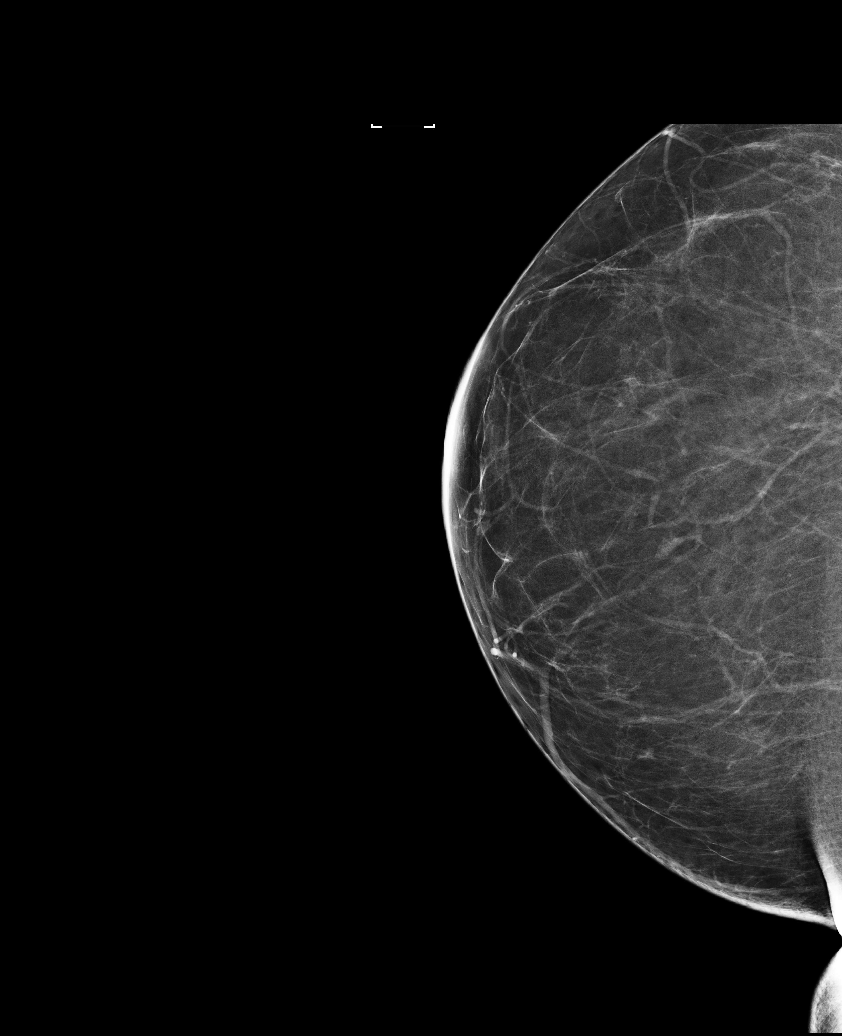

[L MLO]
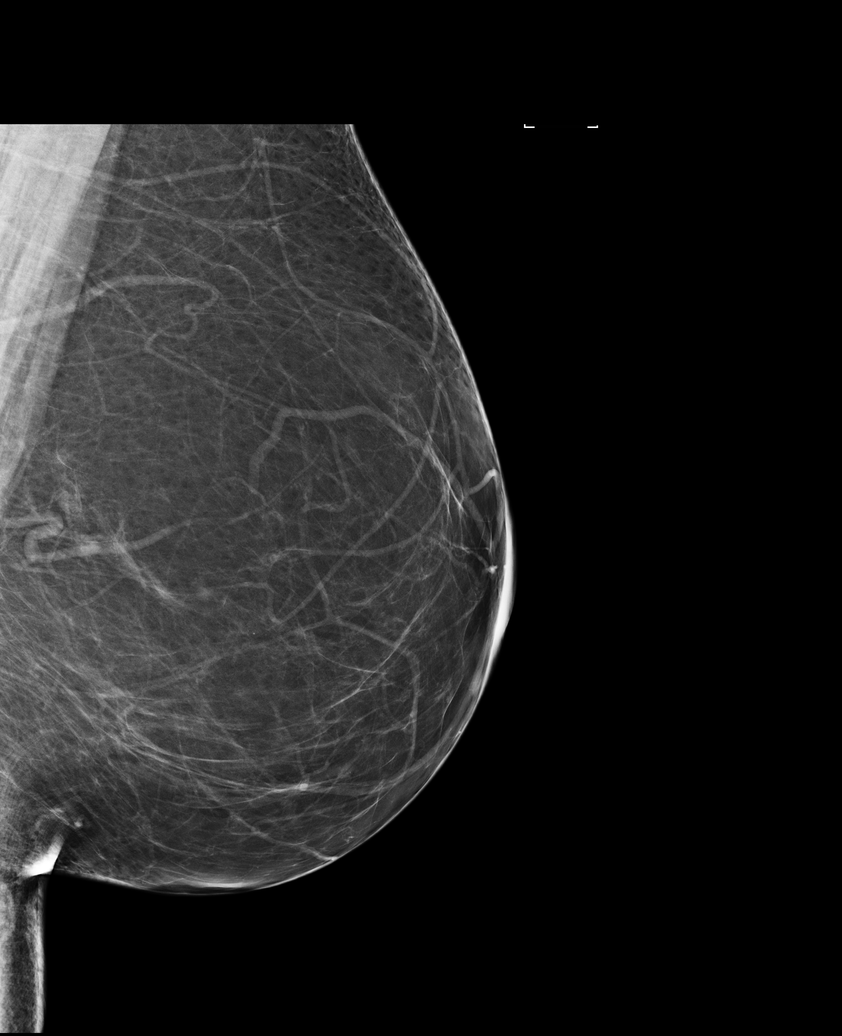

[5 of 5 positions shown; findings below may reference images not displayed]

FINDINGS: There are no findings suspicious for malignancy. Images were
processed with CAD.
IMPRESSION: No mammographic evidence of malignancy. A result letter of this
screening mammogram will be mailed directly to the patient.

RECOMMENDATION:
Screening mammogram in one year. (Code:[id number])

BI-RADS CATEGORY  2: Benign.

## 2020-01-17 ENCOUNTER — Other Ambulatory Visit: Payer: Self-pay | Admitting: Physician Assistant

## 2020-01-17 DIAGNOSIS — N6489 Other specified disorders of breast: Secondary | ICD-10-CM

## 2020-01-28 ENCOUNTER — Ambulatory Visit
Admission: RE | Admit: 2020-01-28 | Discharge: 2020-01-28 | Disposition: A | Discharge status: Home / Self Care | Payer: Managed Care, Other (non HMO) | Source: Ambulatory Visit | Attending: Physician Assistant | Admitting: Physician Assistant

## 2020-01-28 DIAGNOSIS — N6489 Other specified disorders of breast: Secondary | ICD-10-CM

## 2020-02-01 ENCOUNTER — Other Ambulatory Visit: Payer: Self-pay | Admitting: Physician Assistant

## 2020-02-01 DIAGNOSIS — R928 Other abnormal and inconclusive findings on diagnostic imaging of breast: Secondary | ICD-10-CM

## 2020-02-01 DIAGNOSIS — N6489 Other specified disorders of breast: Secondary | ICD-10-CM

## 2020-06-23 ENCOUNTER — Emergency Department: Payer: Managed Care, Other (non HMO)

## 2020-06-23 ENCOUNTER — Other Ambulatory Visit: Payer: Self-pay

## 2020-06-23 ENCOUNTER — Encounter: Payer: Self-pay | Admitting: Emergency Medicine

## 2020-06-23 ENCOUNTER — Emergency Department
Admission: EM | Admit: 2020-06-23 | Discharge: 2020-06-23 | Disposition: A | Discharge status: Home / Self Care | Payer: Managed Care, Other (non HMO) | Attending: Emergency Medicine | Admitting: Emergency Medicine

## 2020-06-23 DIAGNOSIS — Z87891 Personal history of nicotine dependence: Secondary | ICD-10-CM | POA: Insufficient documentation

## 2020-06-23 DIAGNOSIS — Z7951 Long term (current) use of inhaled steroids: Secondary | ICD-10-CM | POA: Insufficient documentation

## 2020-06-23 DIAGNOSIS — I10 Essential (primary) hypertension: Secondary | ICD-10-CM | POA: Insufficient documentation

## 2020-06-23 DIAGNOSIS — S4991XA Unspecified injury of right shoulder and upper arm, initial encounter: Secondary | ICD-10-CM | POA: Diagnosis not present

## 2020-06-23 DIAGNOSIS — Y9289 Other specified places as the place of occurrence of the external cause: Secondary | ICD-10-CM | POA: Diagnosis not present

## 2020-06-23 DIAGNOSIS — Y998 Other external cause status: Secondary | ICD-10-CM | POA: Diagnosis not present

## 2020-06-23 DIAGNOSIS — Y9389 Activity, other specified: Secondary | ICD-10-CM | POA: Insufficient documentation

## 2020-06-23 DIAGNOSIS — E119 Type 2 diabetes mellitus without complications: Secondary | ICD-10-CM | POA: Insufficient documentation

## 2020-06-23 DIAGNOSIS — Z794 Long term (current) use of insulin: Secondary | ICD-10-CM | POA: Insufficient documentation

## 2020-06-23 DIAGNOSIS — J45909 Unspecified asthma, uncomplicated: Secondary | ICD-10-CM | POA: Diagnosis not present

## 2020-06-23 DIAGNOSIS — W19XXXA Unspecified fall, initial encounter: Secondary | ICD-10-CM | POA: Insufficient documentation

## 2020-06-23 DIAGNOSIS — R42 Dizziness and giddiness: Secondary | ICD-10-CM | POA: Diagnosis present

## 2020-06-23 DIAGNOSIS — Z79899 Other long term (current) drug therapy: Secondary | ICD-10-CM | POA: Insufficient documentation

## 2020-06-23 LAB — COMPREHENSIVE METABOLIC PANEL
ALT: 28 U/L (ref 0–44)
AST: 23 U/L (ref 15–41)
Albumin: 4 g/dL (ref 3.5–5.0)
Alkaline Phosphatase: 81 U/L (ref 38–126)
Anion gap: 9 (ref 5–15)
BUN: 11 mg/dL (ref 6–20)
CO2: 27 mmol/L (ref 22–32)
Calcium: 8.9 mg/dL (ref 8.9–10.3)
Chloride: 105 mmol/L (ref 98–111)
Creatinine, Ser: 0.79 mg/dL (ref 0.44–1.00)
GFR calc Af Amer: >60 mL/min (ref >60)
GFR calc non Af Amer: >60 mL/min (ref >60)
Glucose, Bld: 103 mg/dL — ABNORMAL HIGH (ref 70–99)
Potassium: 3.9 mmol/L (ref 3.5–5.1)
Sodium: 141 mmol/L (ref 135–145)
Total Bilirubin: 0.7 mg/dL (ref 0.3–1.2)
Total Protein: 7.6 g/dL (ref 6.5–8.1)

## 2020-06-23 LAB — CBC
HCT: 40.2 % (ref 36.0–46.0)
Hemoglobin: 14.1 g/dL (ref 12.0–15.0)
MCH: 28.6 pg (ref 26.0–34.0)
MCHC: 35.1 g/dL (ref 30.0–36.0)
MCV: 81.5 fL (ref 80.0–100.0)
Platelets: 246 10*3/uL (ref 150–400)
RBC: 4.93 MIL/uL (ref 3.87–5.11)
RDW: 14.3 % (ref 11.5–15.5)
WBC: 10.9 10*3/uL — ABNORMAL HIGH (ref 4.0–10.5)
nRBC: 0 % (ref 0.0–0.2)

## 2020-06-23 LAB — PROTIME-INR
INR: 0.9 (ref 0.8–1.2)
Prothrombin Time: 11.8 seconds (ref 11.4–15.2)

## 2020-06-23 LAB — DIFFERENTIAL
Abs Immature Granulocytes: 0.07 10*3/uL (ref 0.00–0.07)
Basophils Absolute: 0 10*3/uL (ref 0.0–0.1)
Basophils Relative: 0 %
Eosinophils Absolute: 0.2 10*3/uL (ref 0.0–0.5)
Eosinophils Relative: 2 %
Immature Granulocytes: 1 %
Lymphocytes Relative: 31 %
Lymphs Abs: 3.4 10*3/uL (ref 0.7–4.0)
Monocytes Absolute: 0.9 10*3/uL (ref 0.1–1.0)
Monocytes Relative: 8 %
Neutro Abs: 6.4 10*3/uL (ref 1.7–7.7)
Neutrophils Relative %: 58 %

## 2020-06-23 LAB — LIPASE, BLOOD: Lipase: 28 U/L (ref 11–51)

## 2020-06-23 LAB — APTT: aPTT: 29 seconds (ref 24–36)

## 2020-06-23 LAB — GLUCOSE, CAPILLARY: Glucose-Capillary: 105 mg/dL — ABNORMAL HIGH (ref 70–99)

## 2020-06-23 MED ORDER — ONDANSETRON 4 MG PO TBDP
4.0000 mg | ORAL_TABLET | Freq: Once | ORAL | Status: AC
  Administered 2020-06-23: 4 mg via ORAL
  Filled 2020-06-23: qty 1

## 2020-06-23 MED ORDER — MECLIZINE HCL 25 MG PO TABS
25.0000 mg | ORAL_TABLET | Freq: Once | ORAL | Status: AC
  Administered 2020-06-23: 25 mg via ORAL
  Filled 2020-06-23: qty 1

## 2020-06-23 MED ORDER — MECLIZINE HCL 25 MG PO TABS: 25.0000 mg | ORAL_TABLET | Freq: Three times a day (TID) | ORAL | 0 refills | Status: DC | PRN

## 2020-06-23 MED ORDER — ONDANSETRON 4 MG PO TBDP: 4.0000 mg | ORAL_TABLET | Freq: Three times a day (TID) | ORAL | 0 refills | Status: DC | PRN

## 2020-06-23 NOTE — Discharge Instructions (Signed)
Follow-up with your primary care provider if any continued problems.  Also Dr. Pryor Ochoa is on-call for [facility] and because [facility] they specialize in dizziness and inner ear problems. 2 prescriptions were sent to your pharmacy.  The first is Zofran to put on your tongue as needed for nausea.  The other is meclizine 25 mg every 8 hours as needed for dizziness.  If any worsening of your symptoms over the weekend return to the emergency department.  It is not unusual for dizziness to continue for several days but medication should help.

## 2020-06-23 NOTE — ED Triage Notes (Signed)
Pt presents to ED via POV, states last night became severely dizzy after getting out of bed, fell last night, states EMS checked CBG last night within 30 min period pt's CBG went from 99 to 98 to 90. Pt refused transport to hospital last night. Pt awoke this morning with continued severe dizziness, fell twice this AM. Pt c/o R shoulder pain at this time. Pt A&O x 4 in triage.   CBG 105 in triage.

## 2020-06-23 NOTE — ED Notes (Signed)
See triage note  Presents with dizziness and light headedness  Speech clear   States she fell couple of times this am  Also had sxs' last pm

## 2020-06-23 NOTE — ED Provider Notes (Signed)
[facility] Emergency Department Provider Note  ____________________________________________   First MD Initiated Contact with Patient 06/23/20 1212     (approximate)  I have reviewed the triage vital signs and the nursing notes.   HISTORY  Chief Complaint Fall and Dizziness   HPI Kristen Harris is a 59 y.o. female presents to the ED with complaint of dizziness and lightheadedness.  Patient states that after getting out of bed last night she fell injuring her right shoulder.  She reports that EMS was called and they checked her blood sugar which was within normal limits.  Patient at that time refused transport to the hospital.  She woke this morning with continued dizziness and states that she fell again.  She denies any head injury or loss of consciousness.  She states that when she moves her head that she feels the room spinning.  She at this time she also complains of nausea.  She denies any previous episodes of vertigo.  She reports her pain as a 10/10.       Past Medical History:  Diagnosis Date  . Anemia   . Asthma   . Diabetes mellitus without complication ([place])    type 2  . Eczema   . Fibromyalgia   . GERD (gastroesophageal reflux disease)   . History of bronchitis   . History of pneumonia   . Hyperlipidemia   . Hypertension   . Hypertriglyceridemia   . Shortness of breath dyspnea   . Sickle cell trait ([place])   . Sleep apnea   . Spasms of the hands or feet   . Stroke ([place])   . Urinary frequency     Patient Active Problem List   Diagnosis Date Noted  . Asthma 10/23/2017  . Eczema 10/23/2017  . Sickle cell trait ([place]) 10/23/2017  . Type 2 diabetes mellitus ([place]) 10/23/2017  . Headache disorder 10/01/2017  . Intractable chronic migraine without aura and without status migrainosus 10/01/2017  . Numbness and tingling in both hands 10/01/2017  . Leukocytosis 07/15/2016  . Lymphocytosis 07/15/2016  . Breast hypertrophy 09/21/2015  .  Essential hypertension 01/12/2015  . Hyperlipidemia, mixed 01/12/2015    Past Surgical History:  Procedure Laterality Date  . ABDOMINAL HYSTERECTOMY    . BREAST REDUCTION SURGERY Bilateral 09/21/2015  . BREAST REDUCTION SURGERY Bilateral 09/21/2015   Procedure: BILATERAL BREAST REDUCTION USING FREE NIPPLE GRAFT;  Surgeon: Crissie Reese, MD;  Location: [place];  Service: Plastics;  Laterality: Bilateral;  . CESAREAN SECTION    . COLONOSCOPY WITH PROPOFOL N/A 04/21/2015   Procedure: COLONOSCOPY WITH PROPOFOL;  Surgeon: Josefine Class, MD;  Location: [facility] ENDOSCOPY;  Service: Endoscopy;  Laterality: N/A;  . DIAGNOSTIC LAPAROSCOPY    . ESOPHAGOGASTRODUODENOSCOPY (EGD) WITH PROPOFOL N/A 10/12/2018   Procedure: ESOPHAGOGASTRODUODENOSCOPY (EGD) WITH PROPOFOL;  Surgeon: Jonathon Bellows, MD;  Location: [facility] ENDOSCOPY;  Service: Gastroenterology;  Laterality: N/A;  . REDUCTION MAMMAPLASTY Bilateral 2016    Prior to Admission medications   Medication Sig Start Date End Date Taking? Authorizing Provider  acetaminophen (TYLENOL) 500 MG tablet Take 1,000 mg by mouth every 8 (eight) hours as needed for mild pain or moderate pain.    [provider]  albuterol (PROVENTIL HFA;VENTOLIN HFA) 108 (90 BASE) MCG/ACT inhaler Inhale 2 puffs into the lungs every 6 (six) hours as needed for wheezing or shortness of breath.    [provider]  ARIPiprazole (ABILIFY) 2 MG tablet  08/27/18   [provider]  atorvastatin (LIPITOR)  20 MG tablet Take 20 mg by mouth daily. 08/22/18   [provider]  busPIRone (BUSPAR) 5 MG tablet Take 5 mg by mouth 3 (three) times daily.    [provider]  CONTOUR NEXT TEST test strip USE TO TEST BLOOD SUGAR TID 07/29/18   [provider]  Cyanocobalamin (B-12 PO) Take 1 mL by mouth daily.     [provider]  docusate sodium (COLACE) 100 MG capsule Take 1 capsule (100 mg total) by mouth daily. Patient not taking: Reported on  09/10/2018 09/22/15   Crissie Reese, MD  DULoxetine (CYMBALTA) 60 MG capsule Take 60 mg by mouth daily. 03/22/15   [provider]  Eszopiclone 3 MG TABS Take by mouth. 08/26/18   [provider]  FLOVENT HFA 220 MCG/ACT inhaler INHALE 2 PUFFS INTO THE LUNGS TWICE DAILY 11/09/18   Flora Lipps, MD  glucose blood (CONTOUR NEXT TEST) test strip TEST BLOOD SUGAR THREE TIMES DAILY 07/28/18   [provider]  HYDROcodone-acetaminophen (NORCO/VICODIN) 5-325 MG tablet Take 1 tablet by mouth every 4 (four) hours as needed. Patient not taking: Reported on 09/10/2018 05/29/17   Harvest Dark, MD  insulin aspart (NOVOLOG) 100 UNIT/ML injection Inject 18 Units into the skin 3 (three) times daily before meals.     [provider]  LEVEMIR FLEXTOUCH 100 UNIT/ML Pen Inject 70 Units into the skin at bedtime.  03/17/15   [provider]  Liraglutide (VICTOZA) 18 MG/3ML SOPN Inject into the skin.    [provider]  lisinopril (PRINIVIL,ZESTRIL) 10 MG tablet Take 1 tablet by mouth daily. 03/17/15   [provider]  losartan (COZAAR) 50 MG tablet  09/06/18   [provider]  meclizine (ANTIVERT) 25 MG tablet Take 1 tablet (25 mg total) by mouth 3 (three) times daily as needed for dizziness. 06/23/20   [name], PA-C  methocarbamol (ROBAXIN) 500 MG tablet Take 1 tablet (500 mg total) by mouth every 6 (six) hours as needed for muscle spasms. 09/22/15   Crissie Reese, MD  omeprazole (PRILOSEC) 20 MG capsule Take 1 capsule by mouth daily. 04/04/15   [provider]  ondansetron (ZOFRAN ODT) 4 MG disintegrating tablet Take 1 tablet (4 mg total) by mouth every 8 (eight) hours as needed for nausea or vomiting. 06/23/20   [name], PA-C  pantoprazole (PROTONIX) 40 MG tablet take 1 tablet by mouth once daily 05/11/18   Flora Lipps, MD  triamcinolone cream (KENALOG) 0.1 % Apply 1 application topically 2 (two) times daily as needed. rash  01/12/15   [provider]  zolpidem (AMBIEN) 5 MG tablet Take 1 tablet (5 mg total) by mouth at bedtime as needed for sleep. Patient not taking: Reported on 10/12/2018 12/15/17   Flora Lipps, MD    Allergies Tramadol, Aspirin, Bioflavonoids, Erythromycin, Morphine and related, Orange fruit [citrus], Penicillins, Pork-derived products, Tetracyclines & related, Tomato, and Pantoprazole  Family History  Problem Relation Age of Onset  . Breast cancer Mother 60  . Diabetes Father   . Lung cancer Father     Social History Social History   Tobacco Use  . Smoking status: Former Smoker    Quit date: 07/17/2009    Years since quitting: 10.9  . Smokeless tobacco: Never Used  Vaping Use  . Vaping Use: Never used  Substance Use Topics  . Alcohol use: Yes    Comment: socially  . Drug use: No    Review of Systems Constitutional: No  fever/chills.  Positive for dizziness. Eyes: No visual changes. ENT: No trauma. Cardiovascular: Denies chest pain. Respiratory: Denies shortness of breath.  Negative for cough. Gastrointestinal: No abdominal pain.  Positive nausea, no vomiting.  Genitourinary: Negative for dysuria. Musculoskeletal: Positive right shoulder pain.  Negative for back pain or lower extremity pain. Skin: Negative for rash. Neurological: Negative for headaches, focal weakness or numbness. ____________________________________________   PHYSICAL EXAM:  VITAL SIGNS: ED Triage Vitals [06/23/20 0931]  Enc Vitals Group     BP 123/86     Pulse Rate 65     Resp 20     Temp 98.8 F (37.1 C)     Temp Source Oral     SpO2 98 %     Weight 230 lb (104.3 kg)     Height 5\' 6"  (1.676 m)     Head Circumference      Peak Flow      Pain Score 10     Pain Loc      Pain Edu?      Excl. in [place]?     Constitutional: Alert and oriented. Well appearing and in no acute distress. Eyes: Conjunctivae are normal. PERRL. EOMI. with mild nystagmus.  Patient is also unable to move her  head from side to side as this causes increased dizziness and increases her nausea. Head: Atraumatic. Nose: No congestion/rhinnorhea.  EACs are clear.  TMs are dull but no erythema or injection is noted. Mouth/Throat: Mucous membranes are moist.  Oropharynx non-erythematous. Neck: No stridor.   Cardiovascular: Normal rate, regular rhythm. Grossly normal heart sounds.  Good peripheral circulation. Respiratory: Normal respiratory effort.  No retractions. Lungs CTAB. Gastrointestinal: Soft and nontender. No distention.  Bowel sounds normoactive x4 quadrants. Musculoskeletal: Moves upper and lower extremities without any difficulty.  There is no pitting edema noted to the lower extremities.  Right shoulder without gross deformity and no bruising is noted.  No crepitus with range of motion. Neurologic:  Normal speech and language. No gross focal neurologic deficits are appreciated.  Skin:  Skin is warm, dry and intact.  No ecchymosis or abrasions are seen. Psychiatric: Mood and affect are normal. Speech and behavior are normal.  ____________________________________________   LABS (all labs ordered are listed, but only abnormal results are displayed)  Labs Reviewed  CBC - Abnormal; Notable for the following components:      Result Value   WBC 10.9 (*)    All other components within normal limits  COMPREHENSIVE METABOLIC PANEL - Abnormal; Notable for the following components:   Glucose, Bld 103 (*)    All other components within normal limits  GLUCOSE, CAPILLARY - Abnormal; Notable for the following components:   Glucose-Capillary 105 (*)    All other components within normal limits  PROTIME-INR  APTT  DIFFERENTIAL  LIPASE, BLOOD  CBG MONITORING, ED  POC URINE PREG, ED    RADIOLOGY   Official radiology report(s): DG Shoulder Right  Result Date: 06/23/2020 CLINICAL DATA:  Fall.  Injury. EXAM: RIGHT SHOULDER - 2+ VIEW COMPARISON:  Chest x-ray 04/16/2016. FINDINGS: Acromioclavicular  glenohumeral degenerative change. No acute bony or joint abnormality identified. Low lung volumes. Pulmonary interstitial prominence cannot be excluded. PA and lateral chest x-ray should be considered to further evaluate. IMPRESSION: Acromioclavicular glenohumeral degenerative change. No acute abnormality identified. Electronically Signed   By: Marcello Moores  Register   On: 06/23/2020 10:17   CT HEAD WO CONTRAST  Result Date: 06/23/2020 CLINICAL DATA:  Dizziness for 1 day, initial encounter  EXAM: CT HEAD WITHOUT CONTRAST TECHNIQUE: Contiguous axial images were obtained from the base of the skull through the vertex without intravenous contrast. COMPARISON:  12/12/2014 FINDINGS: Brain: No evidence of acute infarction, hemorrhage, hydrocephalus, extra-axial collection or mass lesion/mass effect. Vascular: No hyperdense vessel or unexpected calcification. Skull: Normal. Negative for fracture or focal lesion. Sinuses/Orbits: No acute finding. Other: None. IMPRESSION: No acute intracranial abnormality noted. Electronically Signed   By: Inez Catalina M.D.   On: 06/23/2020 10:22    ____________________________________________   PROCEDURES  Procedure(s) performed (including Critical Care):  Procedures   ____________________________________________   INITIAL IMPRESSION / ASSESSMENT AND PLAN / ED COURSE  As part of my medical decision making, I reviewed the following data within the electronic MEDICAL RECORD NUMBER Notes from prior ED visits and [place] Controlled Substance Database  Judie Hollick was evaluated in Emergency Department on 06/23/2020 for the symptoms described in the history of present illness. She was evaluated in the context of the global COVID-19 pandemic, which necessitated consideration that the patient might be at risk for infection with the SARS-CoV-2 virus that causes COVID-19. Institutional protocols and algorithms that pertain to the evaluation of patients at risk for COVID-19 are in a state of rapid  change based on information released by regulatory bodies including the CDC and federal and state organizations. These policies and algorithms were followed during the patient's care in the ED.  59 year old female presents to the ED with complaint of dizziness.  Patient states that she began getting dizzy last evening and that during the night she fell injuring her right shoulder.  EMS was called and checked her blood sugar which was normal.  Patient denied transfer at that time.  She woke this morning with continued dizziness and presents to the ED for evaluation.  CT scan of her head was negative for any acute changes and right shoulder is negative for fracture or acute injury.  Patient was made aware of the findings of each.  Patient was given Zofran ODT for nausea and Antivert 25 mg p.o.  Patient prior to discharge was improving and nausea was completely resolved.  She was still continue to have some dizziness.  A prescription for both these medications were sent to her pharmacy.  She was also given instructions to contact [facility] for further evaluation of her dizziness. ____________________________________________   FINAL CLINICAL IMPRESSION(S) / ED DIAGNOSES  Final diagnoses:  Dizziness     ED Discharge Orders         Ordered    ondansetron (ZOFRAN ODT) 4 MG disintegrating tablet  Every 8 hours PRN     Discontinue  Reprint     06/23/20 1420    meclizine (ANTIVERT) 25 MG tablet  3 times daily PRN     Discontinue  Reprint     06/23/20 1420           Note:  This document was prepared using Dragon voice recognition software and may include unintentional dictation errors.    [name], PA-C 06/23/20 1558    Lavonia Drafts, MD 07/03/20 [phone]

## 2020-09-12 ENCOUNTER — Other Ambulatory Visit: Payer: Self-pay

## 2020-09-12 ENCOUNTER — Ambulatory Visit
Admission: RE | Admit: 2020-09-12 | Discharge: 2020-09-12 | Disposition: A | Discharge status: Home / Self Care | Payer: BC Managed Care – PPO | Source: Ambulatory Visit | Attending: Physician Assistant | Admitting: Physician Assistant

## 2020-09-12 DIAGNOSIS — N6489 Other specified disorders of breast: Secondary | ICD-10-CM | POA: Insufficient documentation

## 2020-09-12 DIAGNOSIS — R928 Other abnormal and inconclusive findings on diagnostic imaging of breast: Secondary | ICD-10-CM | POA: Insufficient documentation

## 2020-11-27 ENCOUNTER — Emergency Department
Admission: EM | Admit: 2020-11-27 | Discharge: 2020-11-28 | Disposition: A | Discharge status: Home / Self Care | Payer: BC Managed Care – PPO | Attending: Emergency Medicine | Admitting: Emergency Medicine

## 2020-11-27 ENCOUNTER — Other Ambulatory Visit: Payer: Self-pay

## 2020-11-27 ENCOUNTER — Emergency Department: Payer: BC Managed Care – PPO

## 2020-11-27 ENCOUNTER — Encounter: Payer: Self-pay | Admitting: Emergency Medicine

## 2020-11-27 DIAGNOSIS — R072 Precordial pain: Secondary | ICD-10-CM | POA: Insufficient documentation

## 2020-11-27 DIAGNOSIS — R112 Nausea with vomiting, unspecified: Secondary | ICD-10-CM | POA: Insufficient documentation

## 2020-11-27 DIAGNOSIS — J00 Acute nasopharyngitis [common cold]: Secondary | ICD-10-CM | POA: Insufficient documentation

## 2020-11-27 DIAGNOSIS — R197 Diarrhea, unspecified: Secondary | ICD-10-CM | POA: Diagnosis not present

## 2020-11-27 DIAGNOSIS — Z5321 Procedure and treatment not carried out due to patient leaving prior to being seen by health care provider: Secondary | ICD-10-CM | POA: Insufficient documentation

## 2020-11-27 LAB — CBC
HCT: 39.9 % (ref 36.0–46.0)
Hemoglobin: 13.9 g/dL (ref 12.0–15.0)
MCH: 28.9 pg (ref 26.0–34.0)
MCHC: 34.8 g/dL (ref 30.0–36.0)
MCV: 83 fL (ref 80.0–100.0)
Platelets: 258 10*3/uL (ref 150–400)
RBC: 4.81 MIL/uL (ref 3.87–5.11)
RDW: 13.5 % (ref 11.5–15.5)
WBC: 13.1 10*3/uL — ABNORMAL HIGH (ref 4.0–10.5)
nRBC: 0 % (ref 0.0–0.2)

## 2020-11-27 LAB — BASIC METABOLIC PANEL
Anion gap: 11 (ref 5–15)
BUN: 12 mg/dL (ref 6–20)
CO2: 25 mmol/L (ref 22–32)
Calcium: 9.2 mg/dL (ref 8.9–10.3)
Chloride: 104 mmol/L (ref 98–111)
Creatinine, Ser: 0.68 mg/dL (ref 0.44–1.00)
GFR, Estimated: >60 mL/min (ref >60)
Glucose, Bld: 96 mg/dL (ref 70–99)
Potassium: 3.9 mmol/L (ref 3.5–5.1)
Sodium: 140 mmol/L (ref 135–145)

## 2020-11-27 LAB — TROPONIN I (HIGH SENSITIVITY)
Troponin I (High Sensitivity): 4 ng/L (ref <18)
Troponin I (High Sensitivity): 4 ng/L (ref <18)

## 2020-11-27 NOTE — ED Triage Notes (Signed)
Pt to ED via POV with c/o sharp substernal CP that started earlier today. Pt also c/o cold symptoms. Pt A&O x4, NAD noted at this time.

## 2021-02-14 ENCOUNTER — Other Ambulatory Visit
Admission: RE | Admit: 2021-02-14 | Discharge: 2021-02-14 | Disposition: A | Discharge status: Home / Self Care | Payer: BC Managed Care – PPO | Source: Ambulatory Visit | Attending: Student | Admitting: Student

## 2021-02-14 DIAGNOSIS — R0789 Other chest pain: Secondary | ICD-10-CM | POA: Diagnosis present

## 2021-02-14 LAB — D-DIMER, QUANTITATIVE: D-Dimer, Quant: 0.76 ug/mL-FEU — ABNORMAL HIGH (ref 0.00–0.50)

## 2021-02-19 ENCOUNTER — Other Ambulatory Visit: Payer: Self-pay | Admitting: Internal Medicine

## 2021-02-19 ENCOUNTER — Other Ambulatory Visit (HOSPITAL_COMMUNITY): Payer: Self-pay | Admitting: Student

## 2021-02-19 ENCOUNTER — Other Ambulatory Visit (HOSPITAL_COMMUNITY): Payer: Self-pay | Admitting: Internal Medicine

## 2021-02-19 DIAGNOSIS — R7989 Other specified abnormal findings of blood chemistry: Secondary | ICD-10-CM

## 2021-02-19 DIAGNOSIS — R0789 Other chest pain: Secondary | ICD-10-CM

## 2021-03-01 ENCOUNTER — Ambulatory Visit: Payer: BC Managed Care – PPO

## 2021-03-14 ENCOUNTER — Ambulatory Visit
Admission: RE | Admit: 2021-03-14 | Discharge: 2021-03-14 | Disposition: A | Discharge status: Home / Self Care | Payer: BC Managed Care – PPO | Source: Ambulatory Visit | Attending: Internal Medicine | Admitting: Internal Medicine

## 2021-03-14 ENCOUNTER — Other Ambulatory Visit: Payer: Self-pay

## 2021-03-14 DIAGNOSIS — R7989 Other specified abnormal findings of blood chemistry: Secondary | ICD-10-CM | POA: Insufficient documentation

## 2021-03-14 MED ORDER — IOHEXOL 350 MG/ML SOLN
75.0000 mL | Freq: Once | INTRAVENOUS | Status: AC | PRN
  Administered 2021-03-14: 75 mL via INTRAVENOUS

## 2021-03-16 ENCOUNTER — Other Ambulatory Visit: Payer: Self-pay | Admitting: Specialist

## 2021-03-16 DIAGNOSIS — F17200 Nicotine dependence, unspecified, uncomplicated: Secondary | ICD-10-CM

## 2021-03-16 DIAGNOSIS — R911 Solitary pulmonary nodule: Secondary | ICD-10-CM

## 2021-03-26 ENCOUNTER — Ambulatory Visit
Admission: RE | Admit: 2021-03-26 | Discharge: 2021-03-26 | Disposition: A | Discharge status: Home / Self Care | Payer: BC Managed Care – PPO | Source: Ambulatory Visit | Attending: Specialist | Admitting: Specialist

## 2021-03-26 ENCOUNTER — Other Ambulatory Visit: Payer: Self-pay

## 2021-03-26 DIAGNOSIS — F17200 Nicotine dependence, unspecified, uncomplicated: Secondary | ICD-10-CM

## 2021-03-26 DIAGNOSIS — R911 Solitary pulmonary nodule: Secondary | ICD-10-CM

## 2021-03-28 ENCOUNTER — Ambulatory Visit (HOSPITAL_COMMUNITY): Payer: BC Managed Care – PPO

## 2021-04-25 ENCOUNTER — Ambulatory Visit (INDEPENDENT_AMBULATORY_CARE_PROVIDER_SITE_OTHER): Payer: BC Managed Care – PPO | Admitting: Pulmonary Disease

## 2021-04-25 ENCOUNTER — Encounter: Payer: Self-pay | Admitting: Pulmonary Disease

## 2021-04-25 ENCOUNTER — Other Ambulatory Visit: Payer: Self-pay

## 2021-04-25 VITALS — BP 130/80 | HR 89 | Temp 97.7°F | Ht 67.0 in | Wt 210.2 lb

## 2021-04-25 DIAGNOSIS — R911 Solitary pulmonary nodule: Secondary | ICD-10-CM

## 2021-04-25 NOTE — Progress Notes (Deleted)
   Subjective:    Patient ID: Kristen Harris, female    DOB: 1961/11/02, 60 y.o.   MRN: [id number]  HPI    Review of Systems     Objective:   Physical Exam        Assessment & Plan:

## 2021-04-25 NOTE — Progress Notes (Signed)
Subjective:    Patient ID: Kristen Harris, female    DOB: 08-21-1961, 60 y.o.   MRN: [id number] Chief Complaint  Patient presents with   Follow-up    PET scan--C/o sob with exertion and chest pain   HPI Patient is a 61 year old former smoker (quit 2009, 12.5 PY) who presents for evaluation of a left lower lobe 14 mm nodule noted incidentally on chest CT angio performed on 27 April for evaluation of chest pain.  She is kindly referred by Dr. Wallene Huh.  As noted the patient had these findings on 27 April this was followed up with a PET/CT that was performed on 13 March at [facility].  I do not have access to the films however the lesion in question had indeterminate uptake on PET/CT.  The patient has been anxious about this nodule.  The lesion would definitely need advanced bronchoscopy techniques to reach.  It is located posteriorly.  The patient wants to know if this nodule is the cause for her severe chest pains however we I explained to her that I doubt that this lesion is the culprit.  She does have a hiatal hernia and significant reflux symptoms and this may be the cause of her chest pain.  She has noted chest pain since the beginning of the year and nothing really helps it however she has not tried antacids for it.  She is being evaluated by cardiology but no distinct cardiac etiology has been found.  Patient has had no anorexia or weight loss.  She has had no cough, hemoptysis or sputum production.  No fevers, chills or sweats.  She had PFTs performed on 27 April by Dr. Raul Del consistent with mild restriction no obstruction noted.  Diffusion capacity was normal.  The patient had a telephone conference with her sister during the visit.  I explained to the patient the procedure of robotic bronchoscopy and the potential follow-up alternatives for this lesion.  I explained that the lesion could very well be a hamartoma which is a benign lesion of the lung.  The patient after this discussion has  elected to follow this lesion expectantly.  She would like to have a follow-up CT in 3 months time.   Review of Systems A 10 point review of systems was performed and it is as noted above otherwise negative.  Past Medical History:  Diagnosis Date   Anemia    Asthma    Diabetes mellitus without complication (HCC)    type 2   Eczema    Fibromyalgia    GERD (gastroesophageal reflux disease)    History of bronchitis    History of pneumonia    Hyperlipidemia    Hypertension    Hypertriglyceridemia    Shortness of breath dyspnea    Sickle cell trait ([place])    Sleep apnea    Spasms of the hands or feet    Stroke [facility])    Urinary frequency    Past Surgical History:  Procedure Laterality Date   ABDOMINAL HYSTERECTOMY     BREAST REDUCTION SURGERY Bilateral 09/21/2015   BREAST REDUCTION SURGERY Bilateral 09/21/2015   Procedure: BILATERAL BREAST REDUCTION USING FREE NIPPLE GRAFT;  Surgeon: Crissie Reese, MD;  Location: [place];  Service: Plastics;  Laterality: Bilateral;   CESAREAN SECTION     COLONOSCOPY WITH PROPOFOL N/A 04/21/2015   Procedure: COLONOSCOPY WITH PROPOFOL;  Surgeon: Josefine Class, MD;  Location: [facility] ENDOSCOPY;  Service: Endoscopy;  Laterality: N/A;   DIAGNOSTIC LAPAROSCOPY  ESOPHAGOGASTRODUODENOSCOPY (EGD) WITH PROPOFOL N/A 10/12/2018   Procedure: ESOPHAGOGASTRODUODENOSCOPY (EGD) WITH PROPOFOL;  Surgeon: Jonathon Bellows, MD;  Location: [facility] ENDOSCOPY;  Service: Gastroenterology;  Laterality: N/A;   REDUCTION MAMMAPLASTY Bilateral 2016   Family History  Problem Relation Age of Onset   Breast cancer Mother 59   Diabetes Father    Lung cancer Father    Social History   Tobacco Use   Smoking status: Former    Packs/day: 0.50    Years: 25.00    Pack years: 12.50    Types: Cigarettes    Quit date: 2009    Years since quitting: 13.8   Smokeless tobacco: Never  Substance Use Topics   Alcohol use: Yes    Comment: socially   Allergies  Allergen Reactions    Tramadol     Other reaction(s): Other (See Comments) Altered mental status   Aspirin    Bioflavonoids Hives    "orange juice" "orange juice"   Erythromycin Hives   Morphine And Related Nausea And Vomiting   Orange Fruit [Citrus] Hives    "orange juice"   Penicillins Hives    Has patient had a PCN reaction causing immediate rash, facial/tongue/throat swelling, SOB or lightheadedness with hypotension: Unknown Has patient had a PCN reaction causing severe rash involving mucus membranes or skin necrosis: Unknown Has patient had a PCN reaction that required hospitalization: Unknown Has patient had a PCN reaction occurring within the last 10 years: Unknown If all of the above answers are "NO", then may proceed with Cephalosporin use.    Pork-Derived Products Hives   Tetracyclines & Related Hives   Tomato    Pantoprazole     Other reaction(s): Vomiting   Current Meds  Medication Sig   acetaminophen (TYLENOL) 500 MG tablet Take 1,000 mg by mouth every 8 (eight) hours as needed for mild pain or moderate pain.   albuterol (PROVENTIL HFA;VENTOLIN HFA) 108 (90 BASE) MCG/ACT inhaler Inhale 2 puffs into the lungs every 6 (six) hours as needed for wheezing or shortness of breath.   atorvastatin (LIPITOR) 20 MG tablet Take 20 mg by mouth daily.   buPROPion (WELLBUTRIN SR) 150 MG 12 hr tablet Take by mouth.   busPIRone (BUSPAR) 5 MG tablet Take 5 mg by mouth 3 (three) times daily.   CONTOUR NEXT TEST test strip USE TO TEST BLOOD SUGAR TID   Dulaglutide (TRULICITY) 1.5 AS/3.4HD SOPN ADMINISTER 1.5 MG UNDER THE SKIN EVERY 7 DAYS   DULoxetine (CYMBALTA) 60 MG capsule Take 60 mg by mouth daily.   Eszopiclone 3 MG TABS Take by mouth.   FLOVENT HFA 220 MCG/ACT inhaler INHALE 2 PUFFS INTO THE LUNGS TWICE DAILY   glucose blood test strip TEST BLOOD SUGAR THREE TIMES DAILY   insulin aspart (NOVOLOG) 100 UNIT/ML injection Inject 18 Units into the skin 3 (three) times daily before meals.    insulin  glargine, 2 Unit Dial, (TOUJEO MAX SOLOSTAR) 300 UNIT/ML Solostar Pen Inject into the skin.   liraglutide (VICTOZA) 18 MG/3ML SOPN Inject into the skin.   lisinopril (PRINIVIL,ZESTRIL) 10 MG tablet Take 1 tablet by mouth daily.   losartan (COZAAR) 50 MG tablet    meclizine (ANTIVERT) 25 MG tablet Take 1 tablet (25 mg total) by mouth 3 (three) times daily as needed for dizziness.   pantoprazole (PROTONIX) 40 MG tablet take 1 tablet by mouth once daily   traZODone (DESYREL) 100 MG tablet Take 100 mg by mouth.   triamcinolone cream (KENALOG) 0.1 % Apply 1  application topically 2 (two) times daily as needed. rash   Immunization History  Administered Date(s) Administered   Influenza,inj,Quad PF,6+ Mos 09/22/2015, 09/07/2020   PFIZER Comirnaty([place])Covid-19 Tri-Sucrose Vaccine 02/11/2020, 03/06/2020, 09/15/2020   Pneumococcal Conjugate-13 12/31/2011   Pneumococcal Polysaccharide-23 07/24/2017      Objective:   Physical Exam BP 130/80 (BP Location: Left Arm, Cuff Size: Normal)   Pulse 89   Temp 97.7 F (36.5 C) (Temporal)   Ht 5\' 7"  (1.702 m)   Wt 210 lb 3.2 oz (95.3 kg)   SpO2 97%   BMI 32.92 kg/m  GENERAL: HEAD: Normocephalic, atraumatic.  EYES: Pupils equal, round, reactive to light.  No scleral icterus.  MOUTH:  NECK: Supple. No thyromegaly. Trachea midline. No JVD.  No adenopathy. PULMONARY: Good air entry bilaterally.  No adventitious sounds. CARDIOVASCULAR: S1 and S2. Regular rate and rhythm.  ABDOMEN: MUSCULOSKELETAL: No joint deformity, no clubbing, no edema.  NEUROLOGIC:  SKIN: Intact,warm,dry. PSYCH:  Representative image from CT angio performed 14 March 2021:     Assessment & Plan:     ICD-10-CM   1. Lung nodule  R91.1 CT SUPER D CHEST WO CONTRAST   We discussed what an indeterminate PET/CT means Patient wants to follow-up with 3 month CT We will see her in follow-up after that is done.     Orders Placed This Encounter  Procedures   CT SUPER D CHEST WO  CONTRAST    [facility] PROTOCOL  ARMS DOWN INSPIRATORY IMAGES NO STICKER ON [place]    Standing Status:   Future    Standing Expiration Date:   10/25/2021    Scheduling Instructions:     06/30/2021    Order Specific Question:   Preferred imaging location?    Answer:   [facility]    Order Specific Question:   Is patient pregnant?    Answer:   No   We will proceed with 70-month CT, will use [facility] protocol at that time and readdress potential robotic bronchoscopy at that time.  Should she decide that she wants biopsy prior to follow-up she is to contact us anytime.  Otherwise follow-up with Dr. Raul Del as scheduled.  Renold Don, MD Advanced Bronchoscopy PCCM [facility]    *This note was dictated using voice recognition software/Dragon.  Despite best efforts to proofread, errors can occur which can change the meaning.  Any change was purely unintentional.

## 2021-04-25 NOTE — Patient Instructions (Signed)
We have sent a referral to neurology to evaluate you for potential nerve origin of your pain.  Do not think that your pain is related to the nodule found in your lung.  As per our discussion today with your sister participating remotely, we will repeat a CT scan of the chest in 3 months and I will see you a few days later to go over the results and then determine what needs to be done at that point.

## 2021-09-11 ENCOUNTER — Ambulatory Visit: Payer: BC Managed Care – PPO

## 2021-09-14 ENCOUNTER — Encounter: Payer: Self-pay | Admitting: Pulmonary Disease

## 2021-09-17 ENCOUNTER — Telehealth: Payer: Self-pay | Admitting: Pulmonary Disease

## 2021-09-17 NOTE — Telephone Encounter (Signed)
Patient was due for super D 06/2021.  CT was scheduled for 09/11/2021 and patient canceled.  ATC home number on file-number is not in service. ATC mobile number--unable to leave vm due to mailbox being full.  Will call back for update.

## 2021-09-18 NOTE — Telephone Encounter (Signed)
Lm x2 for paitent.  Will call once more due to nature of call.

## 2021-09-19 NOTE — Telephone Encounter (Signed)
ATC pt. Mailbox is full. Will close encounter per office protocol.  Letter has been mailed to address on file.

## 2021-09-20 ENCOUNTER — Ambulatory Visit: Payer: BC Managed Care – PPO | Admitting: Pulmonary Disease

## 2022-04-18 ENCOUNTER — Other Ambulatory Visit: Payer: Self-pay | Admitting: Physician Assistant

## 2022-04-18 ENCOUNTER — Telehealth: Payer: Self-pay | Admitting: Pulmonary Disease

## 2022-04-18 DIAGNOSIS — Z1231 Encounter for screening mammogram for malignant neoplasm of breast: Secondary | ICD-10-CM

## 2022-04-18 NOTE — Telephone Encounter (Signed)
Tyler Pita, MD  [place] Coupe, CMA This referral came from [place].  I saw her in June 2022 and she did not follow through with her CT nor her visit.  She actually canceled her CT.  I did communicate with the referring physician and told her we would put her back in the [facility] but that she had not follow through with what  was recommended previously.  Available [facility] is okay   Lm to offer appt.

## 2022-04-19 NOTE — Telephone Encounter (Signed)
Lm x2 for patient.  Letter mailed to address on file.  Will close encounter per office protocol.  

## 2022-05-01 ENCOUNTER — Encounter: Payer: Self-pay | Admitting: Pulmonary Disease

## 2022-05-01 ENCOUNTER — Ambulatory Visit (INDEPENDENT_AMBULATORY_CARE_PROVIDER_SITE_OTHER): Payer: 59 | Admitting: Pulmonary Disease

## 2022-05-01 VITALS — BP 128/74 | HR 81 | Temp 97.8°F | Ht 67.0 in | Wt 203.4 lb

## 2022-05-01 DIAGNOSIS — R911 Solitary pulmonary nodule: Secondary | ICD-10-CM | POA: Diagnosis not present

## 2022-05-01 DIAGNOSIS — J4540 Moderate persistent asthma, uncomplicated: Secondary | ICD-10-CM | POA: Diagnosis not present

## 2022-05-01 NOTE — Progress Notes (Signed)
Subjective:    Patient ID: Kristen Harris, female    DOB: [birth year], 61 y.o.   MRN: [id number] Patient Care Team: Marinda Elk, MD as PCP - General (Physician Assistant)  Chief Complaint  Patient presents with   Follow-up    SOB with incline.    HPI This is a 61 year old former smoker (quit 2009, 12.5 PY) who presents for follow-up of a left lower lobe 14 mm nodule noted incidentally on chest CT angio performed 14 March 2021 for evaluation of chest pain.  She subsequently had a PET/CT that was performed at [facility] on 30 Mar 2021 which was read as indeterminate for the lung nodule due to low FDG avidity though the actual SUV units are not given.  We had previously evaluated her here on 25 April 2021 for this issue.  At that time she had opted for a 27-monthfollow-up CT which she never obtained.  She also was supposed to follow-up and she never followed through.  Apparently she had issues with her insurance.  Since her last visit she has been doing well.  She has not had significant weight loss.  No anorexia.  No cough, sputum production, no hemoptysis.  She has some dyspnea only when walking up inclines but this is not new and it is totally unchanged.  She attributes this to being "large".  She has not had any chest pain.  No orthopnea or paroxysmal nocturnal dyspnea.  No lower extremity edema.  She has asthma and is maintained on Flovent and as needed albuterol.  She endorses compliance with these.  During the visit the patient had her sister on speaker phone for conference call.   Review of Systems A 10 point review of systems was performed and it is as noted above otherwise negative.  Patient Active Problem List   Diagnosis Date Noted   Asthma 10/23/2017   Eczema 10/23/2017   Sickle cell trait ([place] 10/23/2017   Type 2 diabetes mellitus ([place] 10/23/2017   Headache disorder 10/01/2017   Intractable chronic migraine without aura and without status migrainosus 10/01/2017   Numbness  and tingling in both hands 10/01/2017   Leukocytosis 07/15/2016   Lymphocytosis 07/15/2016   Breast hypertrophy 09/21/2015   Essential hypertension 01/12/2015   Hyperlipidemia, mixed 01/12/2015   Social History   Tobacco Use   Smoking status: Former    Packs/day: 0.50    Years: 25.00    Total pack years: 12.50    Types: Cigarettes    Quit date: 2009    Years since quitting: 14.4   Smokeless tobacco: Never  Substance Use Topics   Alcohol use: Yes    Comment: socially   Allergies  Allergen Reactions   Tramadol     Other reaction(s): Other (See Comments) Altered mental status   Aspirin    Bioflavonoids Hives    "orange juice" "orange juice"   Erythromycin Hives   Morphine And Related Nausea And Vomiting   Orange Fruit [Citrus] Hives    "orange juice"   Penicillins Hives    Has patient had a PCN reaction causing immediate rash, facial/tongue/throat swelling, SOB or lightheadedness with hypotension: Unknown Has patient had a PCN reaction causing severe rash involving mucus membranes or skin necrosis: Unknown Has patient had a PCN reaction that required hospitalization: Unknown Has patient had a PCN reaction occurring within the last 10 years: Unknown If all of the above answers are "NO", then may proceed with Cephalosporin use.    Pork-Derived PMetallurgist  Tetracyclines & Related Hives   Tomato    Pantoprazole     Other reaction(s): Vomiting   Current Meds  Medication Sig   acetaminophen (TYLENOL) 500 MG tablet Take 1,000 mg by mouth every 8 (eight) hours as needed for mild pain or moderate pain.   albuterol (PROVENTIL HFA;VENTOLIN HFA) 108 (90 BASE) MCG/ACT inhaler Inhale 2 puffs into the lungs every 6 (six) hours as needed for wheezing or shortness of breath.   atorvastatin (LIPITOR) 20 MG tablet Take 20 mg by mouth daily.   buPROPion (WELLBUTRIN SR) 150 MG 12 hr tablet Take by mouth.   busPIRone (BUSPAR) 5 MG tablet Take 5 mg by mouth 3 (three) times daily.    CONTOUR NEXT TEST test strip USE TO TEST BLOOD SUGAR TID   Cyanocobalamin (B-12 PO) Take 1 mL by mouth daily.   docusate sodium (COLACE) 100 MG capsule Take 1 capsule (100 mg total) by mouth daily.   Dulaglutide (TRULICITY) 1.5 WL/7.9GX SOPN ADMINISTER 1.5 MG UNDER THE SKIN EVERY 7 DAYS   DULoxetine (CYMBALTA) 60 MG capsule Take 60 mg by mouth daily.   Eszopiclone 3 MG TABS Take by mouth.   FLOVENT HFA 220 MCG/ACT inhaler INHALE 2 PUFFS INTO THE LUNGS TWICE DAILY   glucose blood test strip TEST BLOOD SUGAR THREE TIMES DAILY   HYDROcodone-acetaminophen (NORCO/VICODIN) 5-325 MG tablet Take 1 tablet by mouth every 4 (four) hours as needed.   insulin aspart (NOVOLOG) 100 UNIT/ML injection Inject 18 Units into the skin 3 (three) times daily before meals.    insulin glargine, 2 Unit Dial, (TOUJEO MAX SOLOSTAR) 300 UNIT/ML Solostar Pen Inject into the skin.   liraglutide (VICTOZA) 18 MG/3ML SOPN Inject into the skin.   lisinopril (PRINIVIL,ZESTRIL) 10 MG tablet Take 1 tablet by mouth daily.   losartan (COZAAR) 50 MG tablet    meclizine (ANTIVERT) 25 MG tablet Take 1 tablet (25 mg total) by mouth 3 (three) times daily as needed for dizziness.   pantoprazole (PROTONIX) 40 MG tablet take 1 tablet by mouth once daily   traZODone (DESYREL) 100 MG tablet Take 100 mg by mouth.   triamcinolone cream (KENALOG) 0.1 % Apply 1 application topically 2 (two) times daily as needed. rash       Objective:   Physical Exam BP 128/74 (BP Location: Left Arm, Cuff Size: Normal)   Pulse 81   Temp 97.8 F (36.6 C) (Temporal)   Ht '5\' 7"'$  (1.702 m)   Wt 203 lb 6.4 oz (92.3 kg)   SpO2 97%   BMI 31.86 kg/m  GENERAL: Obese woman, no acute distress, fully ambulatory.  No conversational dyspnea. HEAD: Normocephalic, atraumatic.  EYES: Pupils equal, round, reactive to light.  No scleral icterus.  MOUTH: Oral mucosa moist, no thrush. NECK: Supple. No thyromegaly. Trachea midline. No JVD.  No adenopathy. PULMONARY: Good  air entry bilaterally.  No adventitious sounds. CARDIOVASCULAR: S1 and S2. Regular rate and rhythm.  No rubs, murmurs or gallops heard. ABDOMEN: Obese otherwise benign MUSCULOSKELETAL: No joint deformity, no clubbing, no edema.  NEUROLOGIC: No overt focal deficit, no gait disturbance, speech is fluent. SKIN: Intact,warm,dry. PSYCH: Mood and behavior normal.    Assessment & Plan:     ICD-10-CM   1. Lung nodule - 14 mm LLL  R91.1 CT SUPER D CHEST WO [facility] PILOT   May be hamartoma Needs reimaging Last imaging a year ago Proceed with [facility] protocol CT    2. Moderate persistent asthma without complication  Q11.94  Continue Flovent and as needed albuterol     Orders Placed This Encounter  Procedures   CT SUPER D CHEST WO [facility] PILOT    Standing Status:   Future    Standing Expiration Date:   05/02/2023    Scheduling Instructions:     Next available.    Order Specific Question:   Is patient pregnant?    Answer:   No    Order Specific Question:   Preferred imaging location?    Answer:   [facility]   We will see the patient in follow-up in 4 to 6 weeks time she is to contact us prior to that time should any new difficulties arise.  We will be notifying her if any procedures need to be scheduled prior to her return visit.  Renold Don, MD Advanced Bronchoscopy PCCM [facility]    *This note was dictated using voice recognition software/Dragon.  Despite best efforts to proofread, errors can occur which can change the meaning. Any transcriptional errors that result from this process are unintentional and may not be fully corrected at the time of dictation.

## 2022-05-01 NOTE — Patient Instructions (Addendum)
We are getting a very detailed chest CT to check on the spot in your lung that I think it is what is called a hamartoma.  You may find or mention on this at this website:[email or web address].  We will see him in follow-up in 4 to 6 weeks time.  We will call you the results of the CT once it is done.  If any further work-up is necessary we will let you know prior to your follow-up appointment.

## 2022-05-08 ENCOUNTER — Ambulatory Visit
Admission: RE | Admit: 2022-05-08 | Discharge: 2022-05-08 | Disposition: A | Discharge status: Home / Self Care | Payer: 59 | Source: Ambulatory Visit | Attending: Pulmonary Disease | Admitting: Pulmonary Disease

## 2022-05-08 DIAGNOSIS — R911 Solitary pulmonary nodule: Secondary | ICD-10-CM | POA: Diagnosis not present

## 2022-05-09 ENCOUNTER — Other Ambulatory Visit: Payer: Self-pay

## 2022-05-09 DIAGNOSIS — R911 Solitary pulmonary nodule: Secondary | ICD-10-CM

## 2022-05-14 ENCOUNTER — Ambulatory Visit
Admission: RE | Admit: 2022-05-14 | Discharge: 2022-05-14 | Disposition: A | Discharge status: Home / Self Care | Payer: 59 | Source: Ambulatory Visit | Attending: Physician Assistant | Admitting: Physician Assistant

## 2022-05-14 DIAGNOSIS — Z1231 Encounter for screening mammogram for malignant neoplasm of breast: Secondary | ICD-10-CM | POA: Insufficient documentation

## 2022-07-11 ENCOUNTER — Ambulatory Visit: Payer: 59 | Admitting: Pulmonary Disease

## 2022-10-21 ENCOUNTER — Telehealth: Payer: Self-pay | Admitting: Pulmonary Disease

## 2022-10-21 NOTE — Telephone Encounter (Signed)
I called Kristen Harris today about scheduling her 6 months follow up CT. She called me back and stated that she wasn't worried and didn't want to to the CT at this time

## 2022-10-21 NOTE — Telephone Encounter (Signed)
Noted.  She also did not follow-up as recommended.

## 2023-02-23 ENCOUNTER — Other Ambulatory Visit: Payer: Self-pay

## 2023-02-23 ENCOUNTER — Emergency Department: Payer: 59

## 2023-02-23 ENCOUNTER — Emergency Department
Admission: EM | Admit: 2023-02-23 | Discharge: 2023-02-23 | Disposition: A | Discharge status: Home / Self Care | Payer: 59 | Attending: Emergency Medicine | Admitting: Emergency Medicine

## 2023-02-23 DIAGNOSIS — R55 Syncope and collapse: Secondary | ICD-10-CM

## 2023-02-23 DIAGNOSIS — W01198A Fall on same level from slipping, tripping and stumbling with subsequent striking against other object, initial encounter: Secondary | ICD-10-CM | POA: Insufficient documentation

## 2023-02-23 DIAGNOSIS — S0512XA Contusion of eyeball and orbital tissues, left eye, initial encounter: Secondary | ICD-10-CM | POA: Diagnosis not present

## 2023-02-23 DIAGNOSIS — S0592XA Unspecified injury of left eye and orbit, initial encounter: Secondary | ICD-10-CM | POA: Diagnosis present

## 2023-02-23 LAB — CBC
HCT: 40.5 % (ref 36.0–46.0)
Hemoglobin: 13.9 g/dL (ref 12.0–15.0)
MCH: 28.7 pg (ref 26.0–34.0)
MCHC: 34.3 g/dL (ref 30.0–36.0)
MCV: 83.5 fL (ref 80.0–100.0)
Platelets: 277 10*3/uL (ref 150–400)
RBC: 4.85 MIL/uL (ref 3.87–5.11)
RDW: 13.2 % (ref 11.5–15.5)
WBC: 10.4 10*3/uL (ref 4.0–10.5)
nRBC: 0 % (ref 0.0–0.2)

## 2023-02-23 LAB — BASIC METABOLIC PANEL
Anion gap: 7 (ref 5–15)
BUN: 11 mg/dL (ref 6–20)
CO2: 24 mmol/L (ref 22–32)
Calcium: 8.9 mg/dL (ref 8.9–10.3)
Chloride: 106 mmol/L (ref 98–111)
Creatinine, Ser: 0.68 mg/dL (ref 0.44–1.00)
GFR, Estimated: >60 mL/min (ref >60)
Glucose, Bld: 143 mg/dL — ABNORMAL HIGH (ref 70–99)
Potassium: 4.1 mmol/L (ref 3.5–5.1)
Sodium: 137 mmol/L (ref 135–145)

## 2023-02-23 LAB — URINALYSIS, ROUTINE W REFLEX MICROSCOPIC
Bilirubin Urine: NEGATIVE
Glucose, UA: NEGATIVE mg/dL
Hgb urine dipstick: NEGATIVE
Ketones, ur: NEGATIVE mg/dL
Nitrite: NEGATIVE
Protein, ur: NEGATIVE mg/dL
Specific Gravity, Urine: 1.011 (ref 1.005–1.030)
WBC, UA: >50 WBC/hpf (ref 0–5)
pH: 7 (ref 5.0–8.0)

## 2023-02-23 LAB — CBG MONITORING, ED
Glucose-Capillary: 67 mg/dL — ABNORMAL LOW (ref 70–99)
Glucose-Capillary: 123 mg/dL — ABNORMAL HIGH (ref 70–99)

## 2023-02-23 MED ORDER — SODIUM CHLORIDE 0.9 % IV BOLUS
1000.0000 mL | Freq: Once | INTRAVENOUS | Status: AC
  Administered 2023-02-23: 1000 mL via INTRAVENOUS

## 2023-02-23 MED ORDER — ONDANSETRON 4 MG PO TBDP: 4.0000 mg | ORAL_TABLET | Freq: Three times a day (TID) | ORAL | 0 refills | Status: AC | PRN

## 2023-02-23 MED ORDER — MECLIZINE HCL 25 MG PO TABS: 25.0000 mg | ORAL_TABLET | Freq: Three times a day (TID) | ORAL | 0 refills | Status: AC | PRN

## 2023-02-23 MED ORDER — IBUPROFEN 600 MG PO TABS: 600.0000 mg | ORAL_TABLET | Freq: Three times a day (TID) | ORAL | 0 refills | Status: AC | PRN

## 2023-02-23 NOTE — ED Triage Notes (Signed)
Per Patient's report, patient fell last week after a syncopal episode and has a black left eye with swelling. Patient states she had an episode of vertigo today and nearly fell and decided to come in and be seen. Patient is alert and oriented. Denies any domestic abuse.

## 2023-02-23 NOTE — ED Provider Notes (Signed)
[facility] Provider Note    Event Date/Time   First MD Initiated Contact with Patient 02/23/23 [phone]     (approximate)   History   Fall and Near Syncope   HPI  Kristen Harris is a 62 y.o. female here with left eye bruising and mild headache with occasional dizziness after fall.  The patient states that she fell last week.  She states that she had gotten very very hot at a friend's house who does not use air conditioning.  She began to feel lightheaded.  She stood up.  She then syncopized, and directly hit her face.  Since then, she has had some intermittent episodes of vertigo though she also has a chronic history of vertigo.  She also had bruising to the eye which started 2 to 3 days later and actually has progressively worsened although it now seems to be improving over the last 24 hours.  She denies any double vision.  Denies any focal numbness weakness.  She is not on blood thinners.     Physical Exam   Triage Vital Signs: ED Triage Vitals  Enc Vitals Group     BP 02/23/23 0929 (!) 141/72     Pulse Rate 02/23/23 0929 75     Resp 02/23/23 0929 (!) 22     Temp --      Temp src --      SpO2 02/23/23 0929 96 %     Weight 02/23/23 0922 203 lb (92.1 kg)     Height 02/23/23 0922 5\' 6"  (1.676 m)     Head Circumference --      Peak Flow --      Pain Score 02/23/23 0921 10     Pain Loc --      Pain Edu? --      Excl. in GC? --     Most recent vital signs: Vitals:   02/23/23 0930 02/23/23 1000  BP: 119/79 125/75  Pulse: 83 80  Resp: 19 20  SpO2: 99% 96%     General: Awake, no distress.  CV:  Good peripheral perfusion.  Regular rate and rhythm.  No murmurs. Resp:  Normal work of breathing.  Lungs clear to auscultation bilaterally. Abd:  No distention.  No significant tenderness. Other:  Cranial nerves II through XII intact.  Face is symmetric.  Speech is normal.  Strength 5 5 bilateral upper and lower extremities.  Normal station light touch.  No  dysmetria.  Normal gait.  No nystagmus.  On HEENT exam, the patient has healing left periorbital ecchymoses, with no evidence of significant facial asymmetry.  No midface instability.  No proptosis.  No entrapment on extraocular movements.  Pupils are equal and reactive.  Globes appear intact and symmetric.   ED Results / Procedures / Treatments   Labs (all labs ordered are listed, but only abnormal results are displayed) Labs Reviewed  BASIC METABOLIC PANEL - Abnormal; Notable for the following components:      Result Value   Glucose, Bld 143 (*)    All other components within normal limits  URINALYSIS, ROUTINE W REFLEX MICROSCOPIC - Abnormal; Notable for the following components:   Color, Urine YELLOW (*)    APPearance HAZY (*)    Leukocytes,Ua LARGE (*)    Bacteria, UA RARE (*)    All other components within normal limits  CBG MONITORING, ED - Abnormal; Notable for the following components:   Glucose-Capillary 67 (*)    All other  components within normal limits  CBC  CBG MONITORING, ED     EKG Normal sinus rhythm, trickle rate 80.  PR 164, QRS 89, QTc 438.  No acute ST elevations repress for any acute evidence of acute ischemia or infarct.   RADIOLOGY CT head: No acute intra abnormality CT maxillofacial: No acute maxillofacial fracture, left periorbital soft tissue swelling, no orbit fracture CT cervical spine: Negative   I also independently reviewed and agree with radiologist interpretations.   PROCEDURES:  Critical Care performed: No  .1-3 Lead EKG Interpretation  Performed by: Shaune Pollack, MD Authorized by: Shaune Pollack, MD     Interpretation: normal     ECG rate:  60-90   ECG rate assessment: normal     Rhythm: sinus rhythm     Ectopy: none     Conduction: normal   Comments:     Indication: Weakness     MEDICATIONS ORDERED IN ED: Medications  sodium chloride 0.9 % bolus 1,000 mL (1,000 mLs Intravenous New Bag/Given 02/23/23 1036)      IMPRESSION / MDM / ASSESSMENT AND PLAN / ED COURSE  I reviewed the triage vital signs and the nursing notes.                              Differential diagnosis includes, but is not limited to, mild concussion, ICH, SDH, acute on chronic vertigo, occult cervical injury, anemia, dehydration  Patient's presentation is most consistent with acute presentation with potential threat to life or bodily function.  The patient is on the cardiac monitor to evaluate for evidence of arrhythmia and/or significant heart rate changes      FINAL CLINICAL IMPRESSION(S) / ED DIAGNOSES   Final diagnoses:  Syncope and collapse  Periorbital contusion of left eye, initial encounter     Rx / DC Orders   ED Discharge Orders          Ordered    meclizine (ANTIVERT) 25 MG tablet  3 times daily PRN        02/23/23 1204    ondansetron (ZOFRAN-ODT) 4 MG disintegrating tablet  Every 8 hours PRN        02/23/23 1204    ibuprofen (ADVIL) 600 MG tablet  Every 8 hours PRN        02/23/23 1204             Note:  This document was prepared using Dragon voice recognition software and may include unintentional dictation errors.   Shaune Pollack, MD 02/23/23 1204

## 2023-06-09 ENCOUNTER — Other Ambulatory Visit: Payer: Self-pay | Admitting: Physician Assistant

## 2023-06-09 DIAGNOSIS — Z0000 Encounter for general adult medical examination without abnormal findings: Secondary | ICD-10-CM

## 2023-06-09 DIAGNOSIS — R911 Solitary pulmonary nodule: Secondary | ICD-10-CM

## 2023-06-16 ENCOUNTER — Ambulatory Visit: Admission: RE | Admit: 2023-06-16 | Payer: 59 | Source: Ambulatory Visit

## 2023-06-27 ENCOUNTER — Emergency Department: Payer: 59

## 2023-06-27 ENCOUNTER — Emergency Department
Admission: EM | Admit: 2023-06-27 | Discharge: 2023-06-27 | Disposition: A | Discharge status: Home / Self Care | Payer: 59 | Attending: Emergency Medicine | Admitting: Emergency Medicine

## 2023-06-27 ENCOUNTER — Other Ambulatory Visit: Payer: Self-pay

## 2023-06-27 DIAGNOSIS — R0789 Other chest pain: Secondary | ICD-10-CM | POA: Diagnosis not present

## 2023-06-27 DIAGNOSIS — R519 Headache, unspecified: Secondary | ICD-10-CM | POA: Diagnosis not present

## 2023-06-27 DIAGNOSIS — R0602 Shortness of breath: Secondary | ICD-10-CM | POA: Insufficient documentation

## 2023-06-27 LAB — BASIC METABOLIC PANEL
Anion gap: 9 (ref 5–15)
BUN: 13 mg/dL (ref 6–20)
CO2: 25 mmol/L (ref 22–32)
Calcium: 9.2 mg/dL (ref 8.9–10.3)
Chloride: 104 mmol/L (ref 98–111)
Creatinine, Ser: 0.71 mg/dL (ref 0.44–1.00)
GFR, Estimated: >60 mL/min (ref >60)
Glucose, Bld: 125 mg/dL — ABNORMAL HIGH (ref 70–99)
Potassium: 4.3 mmol/L (ref 3.5–5.1)
Sodium: 138 mmol/L (ref 135–145)

## 2023-06-27 LAB — CBC
HCT: 40.8 % (ref 36.0–46.0)
Hemoglobin: 14.1 g/dL (ref 12.0–15.0)
MCH: 28.9 pg (ref 26.0–34.0)
MCHC: 34.6 g/dL (ref 30.0–36.0)
MCV: 83.6 fL (ref 80.0–100.0)
Platelets: 278 10*3/uL (ref 150–400)
RBC: 4.88 MIL/uL (ref 3.87–5.11)
RDW: 13 % (ref 11.5–15.5)
WBC: 11.9 10*3/uL — ABNORMAL HIGH (ref 4.0–10.5)
nRBC: 0 % (ref 0.0–0.2)

## 2023-06-27 LAB — TROPONIN I (HIGH SENSITIVITY): Troponin I (High Sensitivity): 3 ng/L (ref <18)

## 2023-06-27 NOTE — ED Notes (Signed)
Per ERP pt did not want any further testing and wanted to be discharged.

## 2023-06-27 NOTE — Discharge Instructions (Signed)
Please seek medical attention for any high fevers, chest pain, shortness of breath, change in behavior, persistent vomiting, bloody stool or any other new or concerning symptoms.  

## 2023-06-27 NOTE — ED Provider Notes (Signed)
[facility] Provider Note    Event Date/Time   First MD Initiated Contact with Patient 06/27/23 1713     (approximate)   History   Chest pain  HPI  Kristen Harris is a 62 y.o. female presents to the emergency department today because of concerns for chest pain.  Patient states that she woke up this morning feeling unwell.  She did try going to work.  When there she started developing sharp chest pain.  This was located in the lower chest.  It was intermittent.  The patient states she did feel some associated shortness of breath.  Additionally she has developed a significant headache today.  Does have a history of migraine headaches however they were a number of years in the past.  Patient denies any recent travel.  Denies any known sick contacts.     Physical Exam   Triage Vital Signs: ED Triage Vitals  Encounter Vitals Group     BP 06/27/23 1451 (!) 137/100     Systolic BP Percentile --      Diastolic BP Percentile --      Pulse Rate 06/27/23 1448 84     Resp 06/27/23 1448 20     Temp 06/27/23 1448 98.8 F (37.1 C)     Temp src --      SpO2 06/27/23 1448 100 %     Weight 06/27/23 1447 198 lb (89.8 kg)     Height 06/27/23 1447 5\' 6"  (1.676 m)     Head Circumference --      Peak Flow --      Pain Score 06/27/23 1447 10     Pain Loc --      Pain Education --      Exclude from Growth Chart --     Most recent vital signs: Vitals:   06/27/23 1448 06/27/23 1451  BP:  (!) 137/100  Pulse: 84   Resp: 20   Temp: 98.8 F (37.1 C)   SpO2: 100%    General: Awake, alert, oriented. CV:  Good peripheral perfusion. Regular rate and rhythm. Resp:  Normal effort. Lungs clear. Abd:  No distention.  Other:  No lower extremity edema.    ED Results / Procedures / Treatments   Labs (all labs ordered are listed, but only abnormal results are displayed) Labs Reviewed  BASIC METABOLIC PANEL - Abnormal; Notable for the following components:      Result  Value   Glucose, Bld 125 (*)    All other components within normal limits  CBC - Abnormal; Notable for the following components:   WBC 11.9 (*)    All other components within normal limits  TROPONIN I (HIGH SENSITIVITY)  TROPONIN I (HIGH SENSITIVITY)     EKG  I, Phineas Semen, attending physician, personally viewed and interpreted this EKG  EKG Time: 1453 Rate: 82 Rhythm: normal sinus rhythm Axis: normal Intervals: qtc 441 QRS: narrow, q waves v1 ST changes: no st elevation Impression: abnormal ekg   RADIOLOGY I independently interpreted and visualized the CXR. My interpretation: No pneumonia Radiology interpretation:  IMPRESSION:  No acute cardiopulmonary process.      PROCEDURES:  Critical Care performed: No    MEDICATIONS ORDERED IN ED: Medications - No data to display   IMPRESSION / MDM / ASSESSMENT AND PLAN / ED COURSE  I reviewed the triage vital signs and the nursing notes.  Differential diagnosis includes, but is not limited to, pneumonia, PE, pneumothorax, esophagitis, COVID, ACS  Patient's presentation is most consistent with acute presentation with potential threat to life or bodily function.   The patient is on the cardiac monitor to evaluate for evidence of arrhythmia and/or significant heart rate changes.  Patient presented to the emergency department today with primary concern for sharp intermittent chest pain.  Has secondary concern for headache and some shortness of breath.  EKG without any ST elevation or arrhythmia.  Chest x-ray without pneumonia or pneumothorax.  Troponin was negative.  This time I have low suspicion for ACS given negative troponin and that symptoms started earlier in the day and patient's description of the pain.  I have low concern for PE or dissection at this time given patient description of the pain and lack of other symptoms.  Did consider COVID and offered to swab patient however she  declined.  Additionally given patient's headache I did want to give medication here to see if we can get her feeling better however she declined medication for this as well.  Did discuss strict return precautions.      FINAL CLINICAL IMPRESSION(S) / ED DIAGNOSES   Final diagnoses:  Atypical chest pain  Bad headache      Note:  This document was prepared using Dragon voice recognition software and may include unintentional dictation errors.    Phineas Semen, MD 06/27/23 [phone]

## 2023-06-27 NOTE — ED Triage Notes (Signed)
Pt to ED for centralized chest pain, shob, nausea started this am.

## 2023-07-08 ENCOUNTER — Other Ambulatory Visit: Payer: Self-pay | Admitting: Physician Assistant

## 2023-07-08 DIAGNOSIS — Z1231 Encounter for screening mammogram for malignant neoplasm of breast: Secondary | ICD-10-CM

## 2023-08-06 ENCOUNTER — Inpatient Hospital Stay: Admission: RE | Admit: 2023-08-06 | Payer: 59 | Source: Ambulatory Visit

## 2023-10-27 ENCOUNTER — Other Ambulatory Visit: Payer: Self-pay

## 2023-10-27 ENCOUNTER — Emergency Department
Admission: EM | Admit: 2023-10-27 | Discharge: 2023-10-27 | Disposition: A | Discharge status: Home / Self Care | Payer: 59 | Attending: Emergency Medicine | Admitting: Emergency Medicine

## 2023-10-27 ENCOUNTER — Emergency Department: Payer: 59

## 2023-10-27 ENCOUNTER — Encounter: Payer: Self-pay | Admitting: Emergency Medicine

## 2023-10-27 DIAGNOSIS — J45909 Unspecified asthma, uncomplicated: Secondary | ICD-10-CM | POA: Insufficient documentation

## 2023-10-27 DIAGNOSIS — B349 Viral infection, unspecified: Secondary | ICD-10-CM | POA: Insufficient documentation

## 2023-10-27 DIAGNOSIS — E119 Type 2 diabetes mellitus without complications: Secondary | ICD-10-CM | POA: Diagnosis present

## 2023-10-27 DIAGNOSIS — I10 Essential (primary) hypertension: Secondary | ICD-10-CM | POA: Insufficient documentation

## 2023-10-27 DIAGNOSIS — D72829 Elevated white blood cell count, unspecified: Secondary | ICD-10-CM | POA: Diagnosis not present

## 2023-10-27 LAB — COMPREHENSIVE METABOLIC PANEL
ALT: 24 U/L (ref 0–44)
AST: 21 U/L (ref 15–41)
Albumin: 3.5 g/dL (ref 3.5–5.0)
Alkaline Phosphatase: 112 U/L (ref 38–126)
Anion gap: 12 (ref 5–15)
BUN: 11 mg/dL (ref 6–20)
CO2: 21 mmol/L — ABNORMAL LOW (ref 22–32)
Calcium: 8.5 mg/dL — ABNORMAL LOW (ref 8.9–10.3)
Chloride: 102 mmol/L (ref 98–111)
Creatinine, Ser: 0.7 mg/dL (ref 0.44–1.00)
GFR, Estimated: >60 mL/min (ref >60)
Glucose, Bld: 372 mg/dL — ABNORMAL HIGH (ref 70–99)
Potassium: 4.1 mmol/L (ref 3.5–5.1)
Sodium: 135 mmol/L (ref 135–145)
Total Bilirubin: 0.6 mg/dL (ref <1.2)
Total Protein: 7.5 g/dL (ref 6.5–8.1)

## 2023-10-27 LAB — CBC WITH DIFFERENTIAL/PLATELET
Abs Immature Granulocytes: 0.1 10*3/uL — ABNORMAL HIGH (ref 0.00–0.07)
Basophils Absolute: 0.1 10*3/uL (ref 0.0–0.1)
Basophils Relative: 1 %
Eosinophils Absolute: 0.3 10*3/uL (ref 0.0–0.5)
Eosinophils Relative: 2 %
HCT: 36 % (ref 36.0–46.0)
Hemoglobin: 12.5 g/dL (ref 12.0–15.0)
Immature Granulocytes: 1 %
Lymphocytes Relative: 20 %
Lymphs Abs: 2.9 10*3/uL (ref 0.7–4.0)
MCH: 28.8 pg (ref 26.0–34.0)
MCHC: 34.7 g/dL (ref 30.0–36.0)
MCV: 82.9 fL (ref 80.0–100.0)
Monocytes Absolute: 1 10*3/uL (ref 0.1–1.0)
Monocytes Relative: 7 %
Neutro Abs: 9.9 10*3/uL — ABNORMAL HIGH (ref 1.7–7.7)
Neutrophils Relative %: 69 %
Platelets: 235 10*3/uL (ref 150–400)
RBC: 4.34 MIL/uL (ref 3.87–5.11)
RDW: 12.9 % (ref 11.5–15.5)
WBC: 14.2 10*3/uL — ABNORMAL HIGH (ref 4.0–10.5)
nRBC: 0 % (ref 0.0–0.2)

## 2023-10-27 LAB — TROPONIN I (HIGH SENSITIVITY): Troponin I (High Sensitivity): 7 ng/L (ref <18)

## 2023-10-27 LAB — GROUP A STREP BY PCR: Group A Strep by PCR: NOT DETECTED

## 2023-10-27 LAB — LIPASE, BLOOD: Lipase: 28 U/L (ref 11–51)

## 2023-10-27 MED ORDER — SODIUM CHLORIDE 0.9 % IV BOLUS
1000.0000 mL | Freq: Once | INTRAVENOUS | Status: AC
  Administered 2023-10-27: 1000 mL via INTRAVENOUS

## 2023-10-27 MED ORDER — NAPROXEN 500 MG PO TABS: 500.0000 mg | ORAL_TABLET | Freq: Two times a day (BID) | ORAL | 0 refills | Status: AC | PRN

## 2023-10-27 MED ORDER — ONDANSETRON HCL 4 MG/2ML IJ SOLN
4.0000 mg | Freq: Once | INTRAMUSCULAR | Status: AC
  Administered 2023-10-27: 4 mg via INTRAVENOUS
  Filled 2023-10-27: qty 2

## 2023-10-27 MED ORDER — LIDOCAINE VISCOUS HCL 2 % MT SOLN
15.0000 mL | Freq: Once | OROMUCOSAL | Status: AC
  Administered 2023-10-27: 15 mL via OROMUCOSAL
  Filled 2023-10-27: qty 15

## 2023-10-27 MED ORDER — ALBUTEROL SULFATE (2.5 MG/3ML) 0.083% IN NEBU
2.5000 mg | INHALATION_SOLUTION | Freq: Once | RESPIRATORY_TRACT | Status: AC
  Administered 2023-10-27: 2.5 mg via RESPIRATORY_TRACT
  Filled 2023-10-27: qty 3

## 2023-10-27 NOTE — ED Provider Notes (Signed)
[facility] Provider Note    Event Date/Time   First MD Initiated Contact with Patient 10/27/23 1047     (approximate)   History   Emesis   HPI  Ezlynn Cobo is a 62 y.o. female with a history of type 2 diabetes, asthma, hypertension, and hyperlipidemia who presents with multiple complaint for the last 5 days including sore throat, nausea and vomiting, generalized weakness and malaise, cough, body aches, and diarrhea.  The patient tested negative for flu, COVID, and RSV.  She states her throat feels like glass when she tries to swallow.  She has some mild chest and abdominal pain as well.  I reviewed the past medical records per the patient's most recent outpatient encounter was with internal medicine on 10/2 for follow-up of chronic conditions.  She has no recent ED visits or hospitalizations.   Physical Exam   Triage Vital Signs: ED Triage Vitals  Encounter Vitals Group     BP 10/27/23 0947 125/77     Systolic BP Percentile --      Diastolic BP Percentile --      Pulse Rate 10/27/23 0947 93     Resp 10/27/23 0947 18     Temp 10/27/23 0947 98.6 F (37 C)     Temp Source 10/27/23 0947 Oral     SpO2 10/27/23 0947 97 %     Weight 10/27/23 0946 196 lb 3.4 oz (89 kg)     Height 10/27/23 0946 5\' 6"  (1.676 m)     Head Circumference --      Peak Flow --      Pain Score 10/27/23 0946 10     Pain Loc --      Pain Education --      Exclude from Growth Chart --     Most recent vital signs: Vitals:   10/27/23 0947  BP: 125/77  Pulse: 93  Resp: 18  Temp: 98.6 F (37 C)  SpO2: 97%     General: Alert, weak appearing, no distress.  CV:  Good peripheral perfusion.  Resp:  Normal effort.  Lungs CTAB. Abd:  No distention.  Other:  Oropharynx clear with no erythema or exudate.  Dry mucous membranes.   ED Results / Procedures / Treatments   Labs (all labs ordered are listed, but only abnormal results are displayed) Labs Reviewed  COMPREHENSIVE  METABOLIC PANEL - Abnormal; Notable for the following components:      Result Value   CO2 21 (*)    Glucose, Bld 372 (*)    Calcium 8.5 (*)    All other components within normal limits  CBC WITH DIFFERENTIAL/PLATELET - Abnormal; Notable for the following components:   WBC 14.2 (*)    Neutro Abs 9.9 (*)    Abs Immature Granulocytes 0.10 (*)    All other components within normal limits  GROUP A STREP BY PCR  LIPASE, BLOOD  URINALYSIS, ROUTINE W REFLEX MICROSCOPIC  TROPONIN I (HIGH SENSITIVITY)     EKG  ED ECG REPORT I, Dionne Bucy, the attending physician, personally viewed and interpreted this ECG.  Date: 10/27/2023 EKG Time: 0948 Rate: 96 Rhythm: normal sinus rhythm QRS Axis: normal Intervals: normal ST/T Wave abnormalities: normal Narrative Interpretation: no evidence of acute ischemia    RADIOLOGY  Chest x-ray: I independently viewed and interpreted the images; there is no focal consolidation or edema   PROCEDURES:  Critical Care performed: No  Procedures   MEDICATIONS ORDERED IN ED: Medications  sodium chloride 0.9 % bolus 1,000 mL (1,000 mLs Intravenous New Bag/Given 10/27/23 1128)  ondansetron (ZOFRAN) injection 4 mg (4 mg Intravenous Given 10/27/23 1129)  albuterol (PROVENTIL) (2.5 MG/3ML) 0.083% nebulizer solution 2.5 mg (2.5 mg Nebulization Given 10/27/23 1129)  lidocaine (XYLOCAINE) 2 % viscous mouth solution 15 mL (15 mLs Mouth/Throat Given 10/27/23 1129)     IMPRESSION / MDM / ASSESSMENT AND PLAN / ED COURSE  I reviewed the triage vital signs and the nursing notes.  62 year old female with PMH as noted above presents with multiple complaints over the last 5 days including sore throat, cough, chest abdominal discomfort, vomiting, and diarrhea.  On exam she is weak appearing but in no distress.  Her vital signs are normal.  Mucous membranes are dry.  Oropharynx is clear.  Differential diagnosis includes, but is not limited to, viral syndrome,  dehydration, electrolyte abnormality, AKI, strep pharyngitis.  I do not suspect ACS or other cardiac etiology given the multiple other symptoms.  The patient has tested negative for flu, COVID, and RSV outpatient already.  Here, strep swab is negative.  Troponin is negative.  CBC shows leukocytosis.  CMP is unremarkable.  Chest x-ray shows no evidence of pneumonia.  I suspect that the sore throat is mainly due to stomach acid and vomiting.  We will give fluids, albuterol, viscous lidocaine, and reassess.  Patient's presentation is most consistent with acute complicated illness / injury requiring diagnostic workup.  ----------------------------------------- 1:11 PM on 10/27/2023 -----------------------------------------  The patient is feeling slightly better after the treatment and states she is feeling well to go home.  I have prescribed naproxen for pain and bodyaches.  She has nausea medication at home.  I counseled her on the results of the workup, likely etiology of her symptoms, and the plan of care.  I gave strict return precautions and she expressed understanding.  FINAL CLINICAL IMPRESSION(S) / ED DIAGNOSES   Final diagnoses:  Viral syndrome     Rx / DC Orders   ED Discharge Orders          Ordered    naproxen (NAPROSYN) 500 MG tablet  2 times daily PRN        10/27/23 1310             Note:  This document was prepared using Dragon voice recognition software and may include unintentional dictation errors.    Dionne Bucy, MD 10/27/23 1312

## 2023-10-27 NOTE — ED Triage Notes (Signed)
Patient to ED via POV form [facility] for vomiting since Thursday while on vacation in [place]. States sore throat and that it feels like glass.  Neg Covid/flu/rsv at [facility]. Had near syncopal episode at [facility].

## 2023-10-27 NOTE — Discharge Instructions (Addendum)
You may take the naproxen as needed for throat pain, body aches or fever.  Use your nausea medicine at home as needed.  Drink plenty of fluids.  Return to the ER for new, worsening, or persistent severe chest pain, shortness of breath, nausea or vomiting, weakness, or any other new or worsening symptoms that concern you.  Follow-up with your primary care provider.

## 2023-10-27 NOTE — ED Triage Notes (Signed)
Pt comes from [facility] with c/o nausea, congestion and cough. Pt states she went on vacation and just got back this week. Pt had syncopal episode at [facility].

## 2023-10-27 NOTE — ED Provider Triage Note (Addendum)
Emergency Medicine Provider Triage Evaluation Note  Kristen Harris , a 62 y.o. female  was evaluated in triage.  Pt complains of multiple complaints. Patient had a near syncopal episode at [facility] today and was sent here. Also reports vomiting and URI symptoms.  Resp panel at [facility] was negative for flu, covid and rsv.  Review of Systems  Positive: CP, nausea, vomiting, sore throat, dizziness Negative: Fever, urinary symptoms  Physical Exam  Ht 5\' 6"  (1.676 m)   Wt 89 kg   BMI 31.67 kg/m  Gen:   Awake, no distress   Resp:  Normal effort  MSK:   Moves extremities without difficulty  Other:    Medical Decision Making  Medically screening exam initiated at 9:47 AM.  Appropriate orders placed.  Kristen Harris was informed that the remainder of the evaluation will be completed by another provider, this initial triage assessment does not replace that evaluation, and the importance of remaining in the ED until their evaluation is complete.     Cameron Ali, PA-C 10/27/23 0948    Cameron Ali, PA-C 10/27/23 [phone]

## 2024-03-25 ENCOUNTER — Other Ambulatory Visit: Payer: Self-pay | Admitting: Physician Assistant

## 2024-03-25 ENCOUNTER — Ambulatory Visit
Admission: RE | Admit: 2024-03-25 | Discharge: 2024-03-25 | Disposition: A | Discharge status: Home / Self Care | Source: Ambulatory Visit | Attending: Physician Assistant | Admitting: Physician Assistant

## 2024-03-25 DIAGNOSIS — G4452 New daily persistent headache (NDPH): Secondary | ICD-10-CM | POA: Insufficient documentation

## 2024-03-29 ENCOUNTER — Other Ambulatory Visit: Payer: Self-pay | Admitting: Physician Assistant

## 2024-03-29 DIAGNOSIS — H547 Unspecified visual loss: Secondary | ICD-10-CM

## 2024-03-29 DIAGNOSIS — R42 Dizziness and giddiness: Secondary | ICD-10-CM

## 2024-03-29 DIAGNOSIS — R112 Nausea with vomiting, unspecified: Secondary | ICD-10-CM

## 2024-03-29 DIAGNOSIS — G4452 New daily persistent headache (NDPH): Secondary | ICD-10-CM

## 2024-04-06 ENCOUNTER — Ambulatory Visit
Admission: RE | Admit: 2024-04-06 | Discharge: 2024-04-06 | Disposition: A | Discharge status: Home / Self Care | Source: Ambulatory Visit | Attending: Physician Assistant | Admitting: Physician Assistant

## 2024-04-06 DIAGNOSIS — H547 Unspecified visual loss: Secondary | ICD-10-CM | POA: Insufficient documentation

## 2024-04-06 DIAGNOSIS — R42 Dizziness and giddiness: Secondary | ICD-10-CM | POA: Insufficient documentation

## 2024-04-06 DIAGNOSIS — R112 Nausea with vomiting, unspecified: Secondary | ICD-10-CM | POA: Insufficient documentation

## 2024-04-06 DIAGNOSIS — G4452 New daily persistent headache (NDPH): Secondary | ICD-10-CM | POA: Diagnosis present

## 2024-04-06 MED ORDER — GADOBUTROL 1 MMOL/ML IV SOLN
10.0000 mL | Freq: Once | INTRAVENOUS | Status: AC | PRN
  Administered 2024-04-06: 10 mL via INTRAVENOUS

## 2024-10-18 ENCOUNTER — Ambulatory Visit (INDEPENDENT_AMBULATORY_CARE_PROVIDER_SITE_OTHER)

## 2024-10-18 ENCOUNTER — Ambulatory Visit: Admission: EM | Admit: 2024-10-18 | Discharge: 2024-10-18 | Disposition: A | Discharge status: Home / Self Care

## 2024-10-18 ENCOUNTER — Encounter: Payer: Self-pay | Admitting: Emergency Medicine

## 2024-10-18 DIAGNOSIS — R079 Chest pain, unspecified: Secondary | ICD-10-CM | POA: Diagnosis not present

## 2024-10-18 DIAGNOSIS — E162 Hypoglycemia, unspecified: Secondary | ICD-10-CM | POA: Diagnosis not present

## 2024-10-18 NOTE — Discharge Instructions (Signed)
 Overall, your physical exam is reassuring.  I did not see any obvious pneumonia on your chest x-ray.  Official radiology results to be available over the next few hours and I will call you if we need to change the treatment plan.  You can ice or heat the tender area.  You can take 600 mg of ibuprofen  every 8 hours to help with your chest wall pain.  Keep a tight eye on your blood sugar.  It is importantly follow-up with a dietitian to help better manage her type 2 diabetes and to prevent any further hypoglycemia.  Seek immediate care at the nearest emergency department if you develop loss of consciousness, or new concerning symptoms.

## 2024-10-18 NOTE — ED Triage Notes (Signed)
 Pt reports L-sided chest pain that started yesterday. States it just feel like pressure above, beside, and below L breast. Pt reports today she started having sharp pain with taking regular breaths and tingling & numbness down her L arm. Denies balance issues, vision changes, or slurred speech. Facial symmetry noted. Notes palpitations for a short period yesterday. Hx of asthma and HTN. No recent URIs. Father had a MI (71yrs).

## 2024-10-18 NOTE — ED Provider Notes (Signed)
 [facility]    CSN: [id number] Arrival date & time: 10/18/24  1345      History   Chief Complaint Chief Complaint  Patient presents with   Chest Pain   Breathing Problem    HPI Kristen Harris is a 63 y.o. female.   Patient presents to clinic over concern of left-sided chest wall pain that started yesterday.  She noticed that the pain started right around when her blood sugar dropped down into the 40s.  She is also noticing some sharp pain with deep breathing.  Noticed that the chest wall pain is reproducible with palpation.  Has not had any vision changes or slurred speech.  Did have some left-sided arm numbness and tingling which will happen occasionally.  This improved after she got her blood sugar up.  History of type 2 diabetes and does wear Dexcom.  Her blood sugars been all over the place with the holidays.  Does have a history of asthma and hypertension.  Had a URI around a month ago, is unsure if maybe this is related. Previous smoker.   Has been taking deeper breaths than usual. Denies cough.   The history is provided by the patient and medical records.  Chest Pain Breathing Problem    Past Medical History:  Diagnosis Date   Anemia    Asthma    Diabetes mellitus without complication (HCC)    type 2   Eczema    Fibromyalgia    GERD (gastroesophageal reflux disease)    History of bronchitis    History of pneumonia    Hyperlipidemia    Hypertension    Hypertriglyceridemia    Shortness of breath dyspnea    Sickle cell trait    Sleep apnea    Spasms of the hands or feet    Stroke [facility])    Urinary frequency     Patient Active Problem List   Diagnosis Date Noted   Asthma 10/23/2017   Eczema 10/23/2017   Sickle cell trait 10/23/2017   Type 2 diabetes mellitus (HCC) 10/23/2017   Headache disorder 10/01/2017   Intractable chronic migraine without aura and without status migrainosus 10/01/2017   Numbness and tingling in both hands 10/01/2017    Leukocytosis 07/15/2016   Lymphocytosis 07/15/2016   Breast hypertrophy in female 01/10/2016   Breast hypertrophy 09/21/2015   Essential hypertension 01/12/2015   Hyperlipidemia, mixed 01/12/2015    Past Surgical History:  Procedure Laterality Date   ABDOMINAL HYSTERECTOMY     BREAST REDUCTION SURGERY Bilateral 09/21/2015   BREAST REDUCTION SURGERY Bilateral 09/21/2015   Procedure: BILATERAL BREAST REDUCTION USING FREE NIPPLE GRAFT;  Surgeon: Alm Sick, MD;  Location: [facility] OR;  Service: Plastics;  Laterality: Bilateral;   CESAREAN SECTION     COLONOSCOPY WITH PROPOFOL  N/A 04/21/2015   Procedure: COLONOSCOPY WITH PROPOFOL ;  Surgeon: Donnice Vaughn Manes, MD;  Location: [facility] ENDOSCOPY;  Service: Endoscopy;  Laterality: N/A;   DIAGNOSTIC LAPAROSCOPY     ESOPHAGOGASTRODUODENOSCOPY (EGD) WITH PROPOFOL  N/A 10/12/2018   Procedure: ESOPHAGOGASTRODUODENOSCOPY (EGD) WITH PROPOFOL ;  Surgeon: Therisa Bi, MD;  Location: [facility] ENDOSCOPY;  Service: Gastroenterology;  Laterality: N/A;   REDUCTION MAMMAPLASTY Bilateral 2016    OB History   No obstetric history on file.      Home Medications    Prior to Admission medications   Medication Sig Start Date End Date Taking? Authorizing Provider  buPROPion (WELLBUTRIN SR) 150 MG 12 hr tablet Take by mouth. 04/12/21  Yes [provider]  busPIRone (  BUSPAR) 5 MG tablet Take 5 mg by mouth 3 (three) times daily.   Yes [provider]  Continuous Glucose Sensor (DEXCOM G7 SENSOR) MISC 1 each by Other route. 09/01/24  Yes [provider]  DULoxetine  (CYMBALTA ) 60 MG capsule Take 60 mg by mouth daily. 03/22/15  Yes [provider]  insulin  aspart (NOVOLOG ) 100 UNIT/ML injection Inject 18 Units into the skin 3 (three) times daily before meals.    Yes [provider]  insulin  lispro (HUMALOG) 100 UNIT/ML KwikPen Inject into the skin as directed. 03/18/23  Yes [provider]  losartan (COZAAR) 50 MG tablet   09/06/18  Yes [provider]  OZEMPIC, 0.25 OR 0.5 MG/DOSE, 2 MG/3ML SOPN Inject 0.5 mg into the skin once a week.   Yes [provider]  traZODone (DESYREL) 100 MG tablet Take 100 mg by mouth. 02/14/21  Yes [provider]  triamcinolone cream (KENALOG) 0.1 % Apply 1 application topically 2 (two) times daily as needed. rash 01/12/15  Yes [provider]  acetaminophen  (TYLENOL ) 500 MG tablet Take 1,000 mg by mouth every 8 (eight) hours as needed for mild pain or moderate pain.    [provider]  albuterol  (PROVENTIL  HFA;VENTOLIN  HFA) 108 (90 BASE) MCG/ACT inhaler Inhale 2 puffs into the lungs every 6 (six) hours as needed for wheezing or shortness of breath.    [provider]  atorvastatin (LIPITOR) 20 MG tablet Take 20 mg by mouth daily. Patient not taking: Reported on 10/18/2024 08/22/18   [provider]  Continuous Glucose Receiver (FREESTYLE LIBRE 2 READER) DEVI 1 each by Other route. Patient not taking: Reported on 10/18/2024 09/15/23   [provider]  Cyanocobalamin (B-12 PO) Take 1 mL by mouth daily. Patient not taking: Reported on 10/18/2024    [provider]  docusate sodium  (COLACE) 100 MG capsule Take 1 capsule (100 mg total) by mouth daily. Patient not taking: Reported on 10/18/2024 09/22/15   Leora Lenis, MD  Dulaglutide (TRULICITY) 1.5 MG/0.5ML SOPN ADMINISTER 1.5 MG UNDER THE SKIN EVERY 7 DAYS Patient not taking: Reported on 10/18/2024 03/23/21   [provider]  Eszopiclone 3 MG TABS Take by mouth. Patient not taking: Reported on 10/18/2024 08/26/18   [provider]  Fingerstix Lancets MISC 1 each by Other route. Patient not taking: Reported on 10/18/2024 08/30/24 08/30/25  [provider]  FLOVENT  HFA 220 MCG/ACT inhaler INHALE 2 PUFFS INTO THE LUNGS TWICE DAILY Patient not taking: Reported on 10/18/2024 11/09/18   Kasa, Kurian, MD  glucose blood (ACCU-CHEK GUIDE TEST) test  strip 1 strip by Other route as needed. Patient not taking: Reported on 10/18/2024 08/30/24 08/30/25  [provider]  HYDROcodone -acetaminophen  (NORCO/VICODIN) 5-325 MG tablet Take 1 tablet by mouth every 4 (four) hours as needed. Patient not taking: Reported on 10/18/2024 05/29/17   Dorothyann Drivers, MD  ibuprofen  (ADVIL ) 600 MG tablet Take 1 tablet (600 mg total) by mouth every 8 (eight) hours as needed for moderate pain. Patient not taking: Reported on 10/18/2024 02/23/23   Angelena Smalls, MD  insulin  glargine, 2 Unit Dial, (TOUJEO MAX SOLOSTAR) 300 UNIT/ML Solostar Pen Inject into the skin. Patient not taking: Reported on 10/18/2024 10/18/20   [provider]  liraglutide (VICTOZA) 18 MG/3ML SOPN Inject into the skin. Patient not taking: Reported on 10/18/2024    [provider]  lisinopril  (PRINIVIL ,ZESTRIL ) 10 MG tablet Take 1 tablet by mouth daily. Patient not taking: Reported on 10/18/2024 03/17/15  [provider]  meclizine  (ANTIVERT ) 25 MG tablet Take 1 tablet (25 mg total) by mouth 3 (three) times daily as needed for dizziness. Patient not taking: Reported on 10/18/2024 02/23/23   Angelena Smalls, MD  naproxen  (NAPROSYN ) 500 MG tablet Take 1 tablet (500 mg total) by mouth 2 (two) times daily as needed. Patient not taking: Reported on 10/18/2024 10/27/23 10/26/24  Jacolyn Pae, MD  ondansetron  (ZOFRAN -ODT) 4 MG disintegrating tablet Take 1 tablet (4 mg total) by mouth every 8 (eight) hours as needed for nausea or vomiting. Patient not taking: Reported on 10/18/2024 02/23/23   Angelena Smalls, MD  pantoprazole  (PROTONIX ) 40 MG tablet take 1 tablet by mouth once daily Patient not taking: Reported on 10/18/2024 05/11/18   Kasa, Kurian, MD  sulfamethoxazole-trimethoprim (BACTRIM DS) 800-160 MG tablet Take 1 tablet by mouth 2 (two) times daily. Patient not taking: Reported on 10/18/2024 08/16/24   [provider]    Family History Family History   Problem Relation Age of Onset   Breast cancer Mother 28   Diabetes Father    Lung cancer Father     Social History Social History   Tobacco Use   Smoking status: Former    Current packs/day: 0.00    Average packs/day: 0.5 packs/day for 25.0 years (12.5 ttl pk-yrs)    Types: Cigarettes    Start date: 63    Quit date: 2009    Years since quitting: 16.9   Smokeless tobacco: Never  Vaping Use   Vaping status: Never Used  Substance Use Topics   Alcohol use: Yes    Comment: socially   Drug use: No     Allergies   Aspirin, Tramadol, Buprenorphine hcl, Citrus, Erythromycin, Ibuprofen , Morphine  and codeine, Orange oil, Pantoprazole , Penicillins, Porcine (pork) protein-containing drug products, Swine bile, Tetracyclines & related, Bioflavonoids, and Tomato   Review of Systems Review of Systems  Per HPI  Physical Exam Triage Vital Signs ED Triage Vitals [10/18/24 1423]  Encounter Vitals Group     BP 136/87     Girls Systolic BP Percentile      Girls Diastolic BP Percentile      Boys Systolic BP Percentile      Boys Diastolic BP Percentile      Pulse Rate 89     Resp 18     Temp 98 F (36.7 C)     Temp Source Oral     SpO2 96 %     Weight      Height      Head Circumference      Peak Flow      Pain Score 9     Pain Loc      Pain Education      Exclude from Growth Chart    No data found.  Updated Vital Signs BP 136/87 (BP Location: Left Arm)   Pulse 89   Temp 98 F (36.7 C) (Oral)   Resp 18   SpO2 96%   Visual Acuity Right Eye Distance:   Left Eye Distance:   Bilateral Distance:    Right Eye Near:   Left Eye Near:    Bilateral Near:     Physical Exam Vitals and nursing note reviewed.  Constitutional:      Appearance: Normal appearance. She is well-developed.  HENT:     Head: Normocephalic.     Right Ear: External ear normal.     Left Ear: External ear normal.     Nose: Nose normal.  Mouth/Throat:     Mouth: Mucous membranes are moist.   Eyes:     Conjunctiva/sclera: Conjunctivae normal.  Cardiovascular:     Rate and Rhythm: Normal rate and regular rhythm.     Heart sounds: Normal heart sounds.  Pulmonary:     Effort: Pulmonary effort is normal.     Breath sounds: Normal breath sounds.  Chest:     Chest wall: Tenderness present.       Comments: Left and right sided chest wall TTP, w/o rashes or skin changes  Skin:    General: Skin is warm and dry.  Neurological:     General: No focal deficit present.     Mental Status: She is alert and oriented to person, place, and time.     GCS: GCS eye subscore is 4. GCS verbal subscore is 5. GCS motor subscore is 6.     Motor: Motor function is intact.     Comments: Strength 5 out of 5 in bilateral upper extremities  Psychiatric:        Mood and Affect: Mood normal.        Behavior: Behavior normal.      UC Treatments / Results  Labs (all labs ordered are listed, but only abnormal results are displayed) Labs Reviewed - No data to display  EKG   Radiology No results found.  Procedures Procedures (including critical care time)  Medications Ordered in UC Medications - No data to display  Initial Impression / Assessment and Plan / UC Course  I have reviewed the triage vital signs and the nursing notes.  Pertinent labs & imaging results that were available during my care of the patient were reviewed by me and considered in my medical decision making (see chart for details).  Vitals and triage reviewed, patient is hemodynamically stable.   Lungs vesicular, heart with regular rate and rhythm.  Chest wall pain is reproducible with palpation and deep breathing.  Started abruptly with a hypoglycemia episode yesterday which is since resolved.  EKG by my interpretation shows normal sinus rhythm without ST elevation or ST depression.  Chest x-ray by my interpretation does not show acute infiltrate.  Encouraged primary management of type 2 diabetes, patient to  follow-up with dietitian.  Reassuring presentation with reproducible chest wall pain.  Symptomatic management discussed.  Plan of care, follow-up care and return precautions given, no questions at this time.    Final Clinical Impressions(s) / UC Diagnoses   Final diagnoses:  Chest pain, unspecified type  Hypoglycemia     Discharge Instructions      Overall, your physical exam is reassuring.  I did not see any obvious pneumonia on your chest x-ray.  Official radiology results to be available over the next few hours and I will call you if we need to change the treatment plan.  You can ice or heat the tender area.  You can take 600 mg of ibuprofen  every 8 hours to help with your chest wall pain.  Keep a tight eye on your blood sugar.  It is importantly follow-up with a dietitian to help better manage her type 2 diabetes and to prevent any further hypoglycemia.  Seek immediate care at the nearest emergency department if you develop loss of consciousness, or new concerning symptoms.     ED Prescriptions   None    PDMP not reviewed this encounter.   Dreama Sabra SAILOR, FNP 10/18/24 1542

## 2024-11-03 ENCOUNTER — Other Ambulatory Visit: Payer: Self-pay | Admitting: Physician Assistant

## 2024-11-03 DIAGNOSIS — Z1231 Encounter for screening mammogram for malignant neoplasm of breast: Secondary | ICD-10-CM

## 2024-12-14 ENCOUNTER — Inpatient Hospital Stay: Admission: RE | Admit: 2024-12-14 | Source: Ambulatory Visit
